# Patient Record
Sex: Female | Born: 1985 | Race: White | Hispanic: No | Marital: Married | Smoking: Never smoker
Health system: Southern US, Community
[De-identification: ages and names within clinical notes are randomized; demographics above are authoritative.]

## PROBLEM LIST (undated history)

## (undated) ENCOUNTER — Inpatient Hospital Stay: Payer: Self-pay

## (undated) DIAGNOSIS — F419 Anxiety disorder, unspecified: Secondary | ICD-10-CM

## (undated) DIAGNOSIS — E8801 Alpha-1-antitrypsin deficiency: Secondary | ICD-10-CM

## (undated) DIAGNOSIS — K219 Gastro-esophageal reflux disease without esophagitis: Secondary | ICD-10-CM

## (undated) DIAGNOSIS — I341 Nonrheumatic mitral (valve) prolapse: Secondary | ICD-10-CM

## (undated) DIAGNOSIS — K649 Unspecified hemorrhoids: Secondary | ICD-10-CM

## (undated) HISTORY — PX: ESOPHAGOGASTRODUODENOSCOPY ENDOSCOPY: SHX5814

## (undated) HISTORY — DX: Nonrheumatic mitral (valve) prolapse: I34.1

## (undated) HISTORY — DX: Anxiety disorder, unspecified: F41.9

## (undated) HISTORY — DX: Gastro-esophageal reflux disease without esophagitis: K21.9

## (undated) HISTORY — DX: Unspecified hemorrhoids: K64.9

## (undated) HISTORY — DX: Alpha-1-antitrypsin deficiency: E88.01

---
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
## 2006-07-28 ENCOUNTER — Emergency Department: Payer: Self-pay | Admitting: Internal Medicine

## 2006-07-28 ENCOUNTER — Other Ambulatory Visit: Payer: Self-pay

## 2008-06-06 ENCOUNTER — Ambulatory Visit: Payer: Self-pay | Admitting: Obstetrics & Gynecology

## 2009-07-01 ENCOUNTER — Emergency Department: Payer: Self-pay | Admitting: Emergency Medicine

## 2010-04-23 ENCOUNTER — Ambulatory Visit: Payer: Self-pay | Admitting: Family Medicine

## 2011-11-24 ENCOUNTER — Emergency Department: Payer: Self-pay | Admitting: Internal Medicine

## 2011-11-24 LAB — URINALYSIS, COMPLETE
Bilirubin,UR: NEGATIVE
Blood: NEGATIVE
Glucose,UR: NEGATIVE mg/dL (ref 0–75)
Leukocyte Esterase: NEGATIVE
Nitrite: NEGATIVE
Ph: 9 (ref 4.5–8.0)
Protein: NEGATIVE
WBC UR: <1 /HPF (ref 0–5)

## 2011-11-24 LAB — CK TOTAL AND CKMB (NOT AT ARMC): CK-MB: 0.5 ng/mL (ref 0.5–3.6)

## 2011-11-24 LAB — CBC
HCT: 37.3 % (ref 35.0–47.0)
MCH: 31.1 pg (ref 26.0–34.0)
MCV: 87 fL (ref 80–100)
RBC: 4.27 10*6/uL (ref 3.80–5.20)
RDW: 12.9 % (ref 11.5–14.5)
WBC: 5.7 10*3/uL (ref 3.6–11.0)

## 2011-11-24 LAB — BASIC METABOLIC PANEL
Anion Gap: 10 (ref 7–16)
BUN: 9 mg/dL (ref 7–18)
Calcium, Total: 9.4 mg/dL (ref 8.5–10.1)
Chloride: 108 mmol/L — ABNORMAL HIGH (ref 98–107)
Creatinine: 0.63 mg/dL (ref 0.60–1.30)
EGFR (Non-African Amer.): >60
Glucose: 92 mg/dL (ref 65–99)
Osmolality: 276 (ref 275–301)
Potassium: 3.2 mmol/L — ABNORMAL LOW (ref 3.5–5.1)
Sodium: 139 mmol/L (ref 136–145)

## 2011-11-24 LAB — TROPONIN I: Troponin-I: <0.02 ng/mL

## 2011-11-24 LAB — DRUG SCREEN, URINE
Barbiturates, Ur Screen: NEGATIVE (ref ?–200)
Cannabinoid 50 Ng, Ur ~~LOC~~: NEGATIVE (ref ?–50)
Cocaine Metabolite,Ur ~~LOC~~: NEGATIVE (ref ?–300)
Opiate, Ur Screen: NEGATIVE (ref ?–300)

## 2014-02-07 ENCOUNTER — Observation Stay: Payer: Self-pay

## 2014-02-07 LAB — PIH PROFILE
Anion Gap: 9 (ref 7–16)
BUN: 7 mg/dL (ref 7–18)
CALCIUM: 9.3 mg/dL (ref 8.5–10.1)
Chloride: 105 mmol/L (ref 98–107)
Co2: 24 mmol/L (ref 21–32)
Creatinine: 0.58 mg/dL — ABNORMAL LOW (ref 0.60–1.30)
EGFR (African American): >60
EGFR (Non-African Amer.): >60
Glucose: 69 mg/dL (ref 65–99)
HCT: 36.5 % (ref 35.0–47.0)
HGB: 12.3 g/dL (ref 12.0–16.0)
MCH: 29.1 pg (ref 26.0–34.0)
MCHC: 33.6 g/dL (ref 32.0–36.0)
MCV: 87 fL (ref 80–100)
Osmolality: 272 (ref 275–301)
POTASSIUM: 4.1 mmol/L (ref 3.5–5.1)
Platelet: 167 10*3/uL (ref 150–440)
RBC: 4.22 10*6/uL (ref 3.80–5.20)
RDW: 17.7 % — AB (ref 11.5–14.5)
SGOT(AST): 31 U/L (ref 15–37)
SODIUM: 138 mmol/L (ref 136–145)
Uric Acid: 5.6 mg/dL (ref 2.6–6.0)
WBC: 9.3 10*3/uL (ref 3.6–11.0)

## 2014-02-07 LAB — PROTEIN / CREATININE RATIO, URINE
Creatinine, Urine: 32 mg/dL (ref 30.0–125.0)
Protein, Random Urine: 8 mg/dL (ref 0–12)
Protein/Creat. Ratio: 250 mg/gCREAT — ABNORMAL HIGH (ref 0–200)

## 2014-02-08 ENCOUNTER — Observation Stay: Payer: Self-pay | Admitting: Obstetrics and Gynecology

## 2014-02-09 ENCOUNTER — Inpatient Hospital Stay: Payer: Self-pay | Admitting: Obstetrics and Gynecology

## 2014-02-09 LAB — CBC WITH DIFFERENTIAL/PLATELET
BASOS ABS: 0.1 10*3/uL (ref 0.0–0.1)
BASOS PCT: 0.3 %
EOS PCT: 0.4 %
Eosinophil #: 0.1 10*3/uL (ref 0.0–0.7)
HCT: 38.5 % (ref 35.0–47.0)
HGB: 12.8 g/dL (ref 12.0–16.0)
LYMPHS ABS: 1.7 10*3/uL (ref 1.0–3.6)
LYMPHS PCT: 10.7 %
MCH: 28.6 pg (ref 26.0–34.0)
MCHC: 33.2 g/dL (ref 32.0–36.0)
MCV: 86 fL (ref 80–100)
MONOS PCT: 5.8 %
Monocyte #: 0.9 x10 3/mm (ref 0.2–0.9)
NEUTROS ABS: 13.4 10*3/uL — AB (ref 1.4–6.5)
Neutrophil %: 82.8 %
Platelet: 212 10*3/uL (ref 150–440)
RBC: 4.47 10*6/uL (ref 3.80–5.20)
RDW: 17.8 % — ABNORMAL HIGH (ref 11.5–14.5)
WBC: 16.2 10*3/uL — AB (ref 3.6–11.0)

## 2014-02-11 LAB — HEMATOCRIT: HCT: 27.5 % — AB (ref 35.0–47.0)

## 2014-05-24 NOTE — H&P (Signed)
L&D Evaluation:  History Expanded:  HPI 29 yo G1 with EDD of 02/07/14 presents at 40 wks for evaluation after elevated BP in office. Denies ha, visual changes. No LOF or VB. +FM. Pt was seen at office for c/o irregular contractions throughout the day (Q 4-15 min)[facility], anxiety, isolation EIF not seen on f/u scan and anemia. A+/RNI/VI/ GBS negative. TDAP declined   Presents with contractions, BP evaluation   Patient's Medical History MVP, anxiety   Patient's Surgical History endoscopy   Medications Pre Natal Vitamins   Allergies Biaxin   Social History none   Exam:  Vital Signs stable  initial BP 140/91, otherwise completely normotensive   General no apparent distress   Mental Status clear   Chest clear   Abdomen gravid, non-tender   Pelvic no external lesions, 1/90/-2   Mebranes Intact   FHT normal rate with no decels, baseline 135, mod variability, + accels, no decels   Ucx irregular   Other PC Ratio: 215, H&H: 12.3 & 36.5, Plt: 167, Uric Acid: 5.6, SGOT 31   Impression:  Impression early labor, evaluation for PIH   Plan:  Comments Pre-eclampsia and labor precautions F/u as scheduled at office Pt declined scheduling IOL at 41 wks at this time   Electronic Signatures: Vella KohlerBrothers, Michail Boyte K (CNM)  (Signed 25-Jan-16 18:26)  Authored: L&D Evaluation   Last Updated: 25-Jan-16 18:26 by Vella KohlerBrothers, Davaris Youtsey K (CNM)

## 2014-05-24 NOTE — H&P (Signed)
L&D Evaluation:  History:  HPI 29 yo G1 with EDD of 02/07/14 presents at 40.2 wks presents to L&D with reports of contractions. She reports that she has been having contractions for the last 2 days. She was seen on L&D last night but did not change so she was sent home. She reports +FM, No LOF or VB. [facility], anxiety, isolation EIF not seen on f/u scan and anemia. A+/RNI/VI/ GBS negative. TDAP declined   Presents with contractions   Patient's Medical History other  MVP, anxiety   Patient's Surgical History other  endoscopy   Medications Pre Natal Vitamins   Allergies other, Biaxin   Social History none   Exam:  Vital Signs stable   General grimacing with contractions   Mental Status clear   Chest clear   Abdomen gravid, tender with contractions   Pelvic no external lesions, 5/90/-1 at 1350   Mebranes Intact   FHT normal rate with no decels, baseline 130, mod variability, + accels, no decels   Ucx regular, every 6   Skin dry, no lesions, no rashes   Impression:  Impression active labor, reactive NST, IUP at 40.1   Plan:  Plan expectant management, anticipate svd   Follow Up Appointment need to schedule. in 6 weeks   Electronic Signatures: Jannet MantisSubudhi, Ethon Wymer (CNM)  (Signed 27-Jan-16 14:55)  Authored: L&D Evaluation   Last Updated: 27-Jan-16 14:55 by Jannet MantisSubudhi, Chandlor Noecker (CNM)

## 2016-03-29 ENCOUNTER — Ambulatory Visit (INDEPENDENT_AMBULATORY_CARE_PROVIDER_SITE_OTHER): Payer: 59 | Admitting: Obstetrics and Gynecology

## 2016-03-29 ENCOUNTER — Encounter: Payer: Self-pay | Admitting: Obstetrics and Gynecology

## 2016-03-29 VITALS — BP 110/70 | HR 87 | Ht 66.0 in | Wt 143.0 lb

## 2016-03-29 DIAGNOSIS — Z01419 Encounter for gynecological examination (general) (routine) without abnormal findings: Secondary | ICD-10-CM

## 2016-03-29 DIAGNOSIS — Z124 Encounter for screening for malignant neoplasm of cervix: Secondary | ICD-10-CM

## 2016-03-29 DIAGNOSIS — Z1331 Encounter for screening for depression: Secondary | ICD-10-CM

## 2016-03-29 DIAGNOSIS — Z1389 Encounter for screening for other disorder: Secondary | ICD-10-CM

## 2016-03-29 DIAGNOSIS — Z1339 Encounter for screening examination for other mental health and behavioral disorders: Secondary | ICD-10-CM

## 2016-03-29 DIAGNOSIS — Z113 Encounter for screening for infections with a predominantly sexual mode of transmission: Secondary | ICD-10-CM | POA: Diagnosis not present

## 2016-03-29 LAB — POCT URINALYSIS DIPSTICK
Bilirubin, UA: NEGATIVE
Blood, UA: NEGATIVE
Glucose, UA: NEGATIVE
Ketones, UA: NEGATIVE
Nitrite, UA: NEGATIVE
PROTEIN UA: NEGATIVE
Spec Grav, UA: 1.025 (ref 1.030–1.035)
UROBILINOGEN UA: 1 (ref ?–2.0)
pH, UA: 6.5 (ref 5.0–8.0)

## 2016-03-29 NOTE — Progress Notes (Signed)
Gynecology Annual Exam  PCP: No PCP Per Patient  Chief Complaint  Patient presents with  . Gynecologic Exam    History of Present Illness:  [name] is a 31 y.o. G1P1001 who LMP was Patient's last menstrual period was 03/13/2016., presents today for her annual examination.  Her menses are regular every 28-30 days, lasting 5 day(s).  Dysmenorrhea none. She does not have intermenstrual bleeding.  She is single partner, contraception - coitus interruptus.  Last Pap: 4 year  Results were: no abnormalities /neg HPV DNA not done given age Hx of STDs: none  Last mammogram: n/a  There is a FH of breast cancer in her maternal grandmother who was in her 42s. There is no FH of ovarian cancer. The patient does do self-breast exams.  Tobacco use: The patient denies current or previous tobacco use. Alcohol use: none Exercise: moderately active  The patient is a vegan.  She is planned to conceive this year.   She has a few other questions.  She is concerned about a bump on her lower abdomen that has been called a probable cyst by her dermatologist.  It has been present for a while and has not changed. Sometimes tender to touch. She requests STD testing and a urine culture.  The patient wears seatbelts: yes.   The patient reports that domestic violence in her life is absent.    Patient wants STD screen for HIV, Hepatitis due to exposure 9 months ago on skin from a stranger. No symptoms. Just wants to be sure.   Review of Systems: Review of Systems  Constitutional: Negative.   HENT: Negative.   Eyes: Negative.   Respiratory: Negative.   Cardiovascular: Negative.   Gastrointestinal: Negative.   Genitourinary: Negative.   Musculoskeletal: Negative.   Skin: Negative.   Neurological: Negative.   Psychiatric/Behavioral: Negative.     Past Medical History:  Diagnosis Date  . Anxiety   . Mitral valve prolapse     Past Surgical History:  Procedure Laterality Date  .  ESOPHAGOGASTRODUODENOSCOPY ENDOSCOPY      Medications:   Medication Sig Start Date End Date Taking? Authorizing Provider  omega-3 acid ethyl esters (LOVAZA) 1 g capsule Take by mouth.    Historical Provider, MD  Prenatal Ca Carb-B6-B12-FA (BP FOLINATAL PLUS B) 1 MG TABS Take by mouth.    Historical Provider, MD   Allergies  Allergen Reactions  . Clarithromycin Hives  . Sulfa Antibiotics Hives    Gynecologic History: Patient's last menstrual period was 03/13/2016.  Obstetric History: G1P1001  Social History   Social History  . Marital status: Married    Spouse name: N/A  . Number of children: N/A  . Years of education: N/A   Occupational History  . Not on file.   Social History Main Topics  . Smoking status: Never Smoker  . Smokeless tobacco: Never Used  . Alcohol use No  . Drug use: No  . Sexual activity: Yes    Birth control/ protection: None   Other Topics Concern  . Not on file   Social History Narrative  . No narrative on file   Family History  Problem Relation Age of Onset  . Hypertension Father   . Prostate cancer Paternal Uncle   . Breast cancer Maternal Grandmother      Physical Exam BP 110/70   Pulse 87   Ht  (1.676 m)   Wt 143 lb (64.9 kg)   LMP 03/13/2016   BMI  23.08 kg/m   Physical Exam  Constitutional: She is oriented to person, place, and time and well-developed, well-nourished, and in no distress. No distress.  HENT:  Head: Normocephalic and atraumatic.  Eyes: Conjunctivae are normal. Left eye exhibits no discharge. No scleral icterus.  Neck: Normal range of motion. Neck supple.  Cardiovascular: Normal rate and regular rhythm.  Exam reveals no gallop and no friction rub.   No murmur heard. Pulmonary/Chest: Effort normal and breath sounds normal. No respiratory distress. She has no wheezes. She has no rales.  Abdominal: Soft. Bowel sounds are normal. She exhibits no distension and no mass. There is no tenderness. There is no  rebound and no guarding.  On patients left lower abdomen along the lower rectus muscle there is a 2mm x 2mm mobile, mildly ttp nodule.  There is no irregularity to its shape. It is located immediately above the rectus muscle. No overlying skin changes.  Genitourinary: Vagina normal, uterus normal, cervix normal, right adnexa normal, left adnexa normal and vulva normal.  Musculoskeletal: Normal range of motion. She exhibits no edema.  Lymphadenopathy:    She has no cervical adenopathy.  Neurological: She is alert and oriented to person, place, and time. No cranial nerve deficit.  Skin: Skin is warm and dry. No rash noted.  Psychiatric: Judgment normal.   Female chaperone present for pelvic and breast  portions of the physical exam  Results: AUDIT Questionnaire (screen for alcoholism): 0 PHQ-9: 1   Assessment: 31 y.o. G1P1001 here for routine annual gynecologic exam.  No concerning findings today. Plan: 1. Women's annual routine gynecological examination - POCT urinalysis dipstick - Urine culture - IGP, Aptima HPV, rfx 16/18,45  (pap smear)  2. Pap smear for cervical cancer screening - IGP, Aptima HPV, rfx 16/18,45  3. Screening for depression PHQ-9 negative  4. Screening for alcohol problem AUDIT Questionnaire negative  5. Screen for STD (sexually transmitted disease) Per patient request.  Also, requested gonorrhea and chlamydia testing with pap smear. - HIV antibody - Hepatitis B surface antibody - Hepatitis C antibody   6. Preconception counseling. Patient plans pregnancy later this year. She is taking a prenatal vitamin and has no other risk factors coming into the pregnancy.   7.  Routine healthcare maintenance including cholesterol, diabetes screening not indicated at this time.  8. Follow up 1 year for routine annual exam or as needed  Thomasene Mohair, MD 03/29/2016 3:31 PM

## 2016-03-31 LAB — URINE CULTURE: Organism ID, Bacteria: NO GROWTH

## 2016-04-02 ENCOUNTER — Encounter: Payer: Self-pay | Admitting: Obstetrics and Gynecology

## 2016-04-02 LAB — IGP, APTIMA HPV, RFX 16/18,45
HPV APTIMA: NEGATIVE
PAP SMEAR COMMENT: 0

## 2016-04-05 ENCOUNTER — Telehealth: Payer: Self-pay | Admitting: Obstetrics and Gynecology

## 2016-04-05 ENCOUNTER — Telehealth: Payer: Self-pay

## 2016-04-05 NOTE — Telephone Encounter (Signed)
Pt is would like a call back of Labs results CB# [phone]

## 2016-04-05 NOTE — Telephone Encounter (Signed)
Pt called.  She was seen last week.  She has the My Chart app.  She recv'd an msg about her urine results but has yet to hear about her pap smear. [phone]

## 2016-04-05 NOTE — Telephone Encounter (Signed)
Pt aware via vm neg results

## 2016-04-10 NOTE — Telephone Encounter (Signed)
Letter has been sent out for pt for results, however if she returns my call, please let me know

## 2016-05-14 ENCOUNTER — Other Ambulatory Visit: Payer: Self-pay | Admitting: Family Medicine

## 2016-05-14 ENCOUNTER — Encounter: Payer: Self-pay | Admitting: Obstetrics and Gynecology

## 2016-05-14 DIAGNOSIS — R1909 Other intra-abdominal and pelvic swelling, mass and lump: Secondary | ICD-10-CM

## 2016-05-15 ENCOUNTER — Ambulatory Visit
Admission: RE | Admit: 2016-05-15 | Discharge: 2016-05-15 | Disposition: A | Discharge status: Home / Self Care | Payer: 59 | Source: Ambulatory Visit | Attending: Family Medicine | Admitting: Family Medicine

## 2016-05-15 DIAGNOSIS — R1909 Other intra-abdominal and pelvic swelling, mass and lump: Secondary | ICD-10-CM | POA: Insufficient documentation

## 2016-05-15 DIAGNOSIS — R590 Localized enlarged lymph nodes: Secondary | ICD-10-CM | POA: Diagnosis not present

## 2016-05-16 ENCOUNTER — Telehealth: Payer: Self-pay | Admitting: *Deleted

## 2016-05-16 NOTE — Telephone Encounter (Signed)
Called patient and informed her that Dr. Merlene Pullingorcoran did not feel as if the appt was an emergency and it was ok to reschedule for her not to miss her family vacation, voiced understanding.

## 2016-05-28 ENCOUNTER — Ambulatory Visit: Payer: 59 | Admitting: Hematology and Oncology

## 2016-06-03 ENCOUNTER — Encounter: Payer: Self-pay | Admitting: Hematology and Oncology

## 2016-06-03 ENCOUNTER — Inpatient Hospital Stay: Payer: 59

## 2016-06-03 ENCOUNTER — Inpatient Hospital Stay: Payer: 59 | Attending: Hematology and Oncology | Admitting: Hematology and Oncology

## 2016-06-03 VITALS — BP 106/72 | HR 99 | Temp 98.2°F | Ht 66.0 in | Wt 144.1 lb

## 2016-06-03 DIAGNOSIS — I341 Nonrheumatic mitral (valve) prolapse: Secondary | ICD-10-CM | POA: Diagnosis not present

## 2016-06-03 DIAGNOSIS — R591 Generalized enlarged lymph nodes: Secondary | ICD-10-CM | POA: Diagnosis not present

## 2016-06-03 DIAGNOSIS — F419 Anxiety disorder, unspecified: Secondary | ICD-10-CM | POA: Insufficient documentation

## 2016-06-03 DIAGNOSIS — K219 Gastro-esophageal reflux disease without esophagitis: Secondary | ICD-10-CM | POA: Insufficient documentation

## 2016-06-03 DIAGNOSIS — Z79899 Other long term (current) drug therapy: Secondary | ICD-10-CM | POA: Insufficient documentation

## 2016-06-03 DIAGNOSIS — R599 Enlarged lymph nodes, unspecified: Secondary | ICD-10-CM

## 2016-06-03 LAB — COMPREHENSIVE METABOLIC PANEL
ALT: 14 U/L (ref 14–54)
AST: 22 U/L (ref 15–41)
Albumin: 5 g/dL (ref 3.5–5.0)
Alkaline Phosphatase: 47 U/L (ref 38–126)
Anion gap: 7 (ref 5–15)
BUN: 12 mg/dL (ref 6–20)
CO2: 24 mmol/L (ref 22–32)
Calcium: 10.3 mg/dL (ref 8.9–10.3)
Chloride: 107 mmol/L (ref 101–111)
Creatinine, Ser: 0.61 mg/dL (ref 0.44–1.00)
GFR calc Af Amer: >60 mL/min (ref >60)
GFR calc non Af Amer: >60 mL/min (ref >60)
Glucose, Bld: 104 mg/dL — ABNORMAL HIGH (ref 65–99)
Potassium: 3.9 mmol/L (ref 3.5–5.1)
Sodium: 138 mmol/L (ref 135–145)
Total Bilirubin: 0.6 mg/dL (ref 0.3–1.2)
Total Protein: 8.1 g/dL (ref 6.5–8.1)

## 2016-06-03 LAB — CBC WITH DIFFERENTIAL/PLATELET
Basophils Absolute: 0 10*3/uL (ref 0–0.1)
Basophils Relative: 1 %
Eosinophils Absolute: 0.1 10*3/uL (ref 0–0.7)
Eosinophils Relative: 2 %
HCT: 37.8 % (ref 35.0–47.0)
Hemoglobin: 13.3 g/dL (ref 12.0–16.0)
Lymphocytes Relative: 29 %
Lymphs Abs: 1.6 10*3/uL (ref 1.0–3.6)
MCH: 30.8 pg (ref 26.0–34.0)
MCHC: 35.3 g/dL (ref 32.0–36.0)
MCV: 87.1 fL (ref 80.0–100.0)
Monocytes Absolute: 0.4 10*3/uL (ref 0.2–0.9)
Monocytes Relative: 8 %
Neutro Abs: 3.3 10*3/uL (ref 1.4–6.5)
Neutrophils Relative %: 60 %
Platelets: 249 10*3/uL (ref 150–440)
RBC: 4.34 MIL/uL (ref 3.80–5.20)
RDW: 13.2 % (ref 11.5–14.5)
WBC: 5.4 10*3/uL (ref 3.6–11.0)

## 2016-06-03 LAB — URIC ACID: Uric Acid, Serum: 4.5 mg/dL (ref 2.3–6.6)

## 2016-06-03 LAB — LACTATE DEHYDROGENASE: LDH: 128 U/L (ref 98–192)

## 2016-06-03 NOTE — Progress Notes (Signed)
Patient here today as new evaluation regarding lymphadenopathy. Ref[facility] Wall[facility]w [facility].

## 2016-06-03 NOTE — Progress Notes (Signed)
[facility] day:  06/03/2016  Chief Complaint: Allyanna Appleman is a 31 y.o. female with adenopathy who is referred in consultation by Dr. Mariana Arn for assessment and management.  HPI: The patient felt a lump in her left groin when she was pregnant. After she lost her pregnancy weight, the lump was easier to feel.  Over the past 2 years, she has not noted much change. She states that her dermatologist felt the area and thought it was a "cyst".  Two weeks after the flu, she felt the area "divided".   She has worried about this area, but did not have a regular physician. She went to the acute care clinic.  She was told she had a swollen lymph node. She states that she feels like she has 2 small things under her skin. She also notes a tiny area under her jaw. She has some sinus drainage.  She denies any fevers, sweats or weight loss.  Left lower extremity soft tissue ultrasound on 05/15/2016 revealed multiple lymph nodes in the left inguinal region with the largest measuring 4 mm. Nodes were likely inflammatory or reactive.   She describes herself as a "germaphobe".  She asks about getting additional labs (hepatitis and HIV testing) drawn after someone threw up on her at a concert.  She had dry cracked skin.  She was scheduled to have these labs drawn today.   Past Medical History:  Diagnosis Date  . Alpha-1-antitrypsin deficiency ([place])   . Anxiety   . GERD (gastroesophageal reflux disease)   . Hemorrhoids   . Mitral valve prolapse     Past Surgical History:  Procedure Laterality Date  . ESOPHAGOGASTRODUODENOSCOPY ENDOSCOPY      Family History  Problem Relation Age of Onset  . Hypertension Father   . Prostate cancer Paternal Uncle   . Breast cancer Maternal Grandmother     Social History:  reports that she has never smoked. She has never used smokeless tobacco. She reports that she does not drink alcohol or use drugs.  She denies any  exposure to radiation or toxins.  She previously was a Pharmacist, hospital in school.  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  She has a daughter at home.  She describes herself as a vegan and a germaphobe.  The patient is alone today.  Allergies:  Allergies  Allergen Reactions  . Clarithromycin Hives  . Robitussin (Alcohol Free) [Guaifenesin] Hives  . Sulfa Antibiotics Hives    Current Medications: Current Outpatient Prescriptions  Medication Sig Dispense Refill  . Cholecalciferol (VITAMIN D3) 1000 units CAPS Take 1 capsule by mouth daily.    Marland Kitchen omega-3 acid ethyl esters (LOVAZA) 1 g capsule Take by mouth.    . Prenatal Ca Carb-B6-B12-FA (BP FOLINATAL PLUS B) 1 MG TABS Take by mouth.     No current facility-administered medications for this visit.     Review of Systems:  GENERAL:  Feels good. No fevers, sweats or weight loss. PERFORMANCE STATUS (ECOG):  0 HEENT:  "Weird taste in mouth" (? cavity).  Slight sore throat.  Sinus drainage.  No visual changes, runny nose, mouth sores or tenderness. Lungs: No shortness of breath or cough.  No hemoptysis. Cardiac:  No chest pain, palpitations, orthopnea, or PND. GI:  No nausea, vomiting, diarrhea, constipation, melena or hematochezia. GU:  No urgency, frequency, dysuria, or hematuria. Musculoskeletal:  No back pain.  No joint pain.  No muscle tenderness. Extremities:  No pain or swelling. Skin:  No rashes or skin changes. Neuro:  No headache, numbness or weakness, balance or coordination issues. Endocrine:  No diabetes, thyroid issues, hot flashes or night sweats. Psych:  No mood changes, depression or anxiety. Pain:  No focal pain. Review of systems:  All other systems reviewed and found to be negative.  Physical Exam: Blood pressure 106/72, pulse 99, temperature 98.2 F (36.8 C), temperature source Tympanic, height '5\' 6"'$  (1.676 m), weight 144 lb 2 oz (65.4 kg). GENERAL:  Well developed, well nourished, woman sitting comfortably in the  exam room in no acute distress. MENTAL STATUS:  Alert and oriented to person, place and time. HEAD:  Long brown hair.  Normocephalic, atraumatic, face symmetric, no Cushingoid features. EYES:  Blue eyes.  Pupils equal round and reactive to light and accomodation.  No conjunctivitis or scleral icterus. ENT:  Oropharynx clear without lesion.  Tongue normal. Mucous membranes moist.  RESPIRATORY:  Clear to auscultation without rales, wheezes or rhonchi. CARDIOVASCULAR:  Regular rate and rhythm without murmur, rub or gallop. ABDOMEN:  Soft, non-tender, with active bowel sounds, and no hepatosplenomegaly.  No masses. SKIN:  No rashes, ulcers or lesions.  No cystic or nodular areas in the suprapubic area (her area of concern). EXTREMITIES: No edema, no skin discoloration or tenderness.  No palpable cords. LYMPH NODES: No palpable cervical, supraclavicular, axillary or inguinal adenopathy. NEUROLOGICAL: Unremarkable. PSYCH:  Appropriate.   Appointment on 06/03/2016  Component Date Value Ref Range Status  . WBC 06/03/2016 5.4  3.6 - 11.0 K/uL Final  . RBC 06/03/2016 4.34  3.80 - 5.20 MIL/uL Final  . Hemoglobin 06/03/2016 13.3  12.0 - 16.0 g/dL Final  . HCT 06/03/2016 37.8  35.0 - 47.0 % Final  . MCV 06/03/2016 87.1  80.0 - 100.0 fL Final  . MCH 06/03/2016 30.8  26.0 - 34.0 pg Final  . MCHC 06/03/2016 35.3  32.0 - 36.0 g/dL Final  . RDW 06/03/2016 13.2  11.5 - 14.5 % Final  . Platelets 06/03/2016 249  150 - 440 K/uL Final  . Neutrophils Relative % 06/03/2016 60  % Final  . Neutro Abs 06/03/2016 3.3  1.4 - 6.5 K/uL Final  . Lymphocytes Relative 06/03/2016 29  % Final  . Lymphs Abs 06/03/2016 1.6  1.0 - 3.6 K/uL Final  . Monocytes Relative 06/03/2016 8  % Final  . Monocytes Absolute 06/03/2016 0.4  0.2 - 0.9 K/uL Final  . Eosinophils Relative 06/03/2016 2  % Final  . Eosinophils Absolute 06/03/2016 0.1  0 - 0.7 K/uL Final  . Basophils Relative 06/03/2016 1  % Final  . Basophils Absolute  06/03/2016 0.0  0 - 0.1 K/uL Final  . Sodium 06/03/2016 138  135 - 145 mmol/L Final  . Potassium 06/03/2016 3.9  3.5 - 5.1 mmol/L Final  . Chloride 06/03/2016 107  101 - 111 mmol/L Final  . CO2 06/03/2016 24  22 - 32 mmol/L Final  . Glucose, Bld 06/03/2016 104* 65 - 99 mg/dL Final  . BUN 06/03/2016 12  6 - 20 mg/dL Final  . Creatinine, Ser 06/03/2016 0.61  0.44 - 1.00 mg/dL Final  . Calcium 06/03/2016 10.3  8.9 - 10.3 mg/dL Final  . Total Protein 06/03/2016 8.1  6.5 - 8.1 g/dL Final  . Albumin 06/03/2016 5.0  3.5 - 5.0 g/dL Final  . AST 06/03/2016 22  15 - 41 U/L Final  . ALT 06/03/2016 14  14 - 54 U/L Final  . Alkaline  Phosphatase 06/03/2016 47  38 - 126 U/L Final  . Total Bilirubin 06/03/2016 0.6  0.3 - 1.2 mg/dL Final  . GFR calc non Af Amer 06/03/2016 >60  >60 mL/min Final  . GFR calc Af Amer 06/03/2016 >60  >60 mL/min Final   Comment: (NOTE) The eGFR has been calculated using the CKD EPI equation. This calculation has not been validated in all clinical situations. eGFR's persistently <60 mL/min signify possible Chronic Kidney Disease.   . Anion gap 06/03/2016 7  5 - 15 Final  . LDH 06/03/2016 128  98 - 192 U/L Final  . Uric Acid, Serum 06/03/2016 4.5  2.3 - 6.6 mg/dL Final    Assessment:  Noelie Avya Flavell is a 31 y.o. female with reactive lymphadenopathy.  She denies any fevers, sweats, or weight loss.  Left lower extremity soft tissue ultrasound on 05/15/2016 revealed multiple lymph nodes in the left inguinal region with the largest measuring 4 mm.  Nodes were likely inflammatory or reactive.  She denies any B symptoms.  Exam reveals no adenopathy or cystic areas.  Plan: 1.  Discuss patient's concerns and recent ultrasound.  Exam reveals no findings concerning for lymphoma or malignancy.  Discuss screening labs.  Reassurance provided. 2.  Labs today:  CBC with diff, CMP,  LDH, uric acid. 3.  RN to call patient with results. 4.  RTC prn.   Lequita Asal, MD   06/03/2016

## 2016-06-04 ENCOUNTER — Telehealth: Payer: Self-pay | Admitting: *Deleted

## 2016-06-04 NOTE — Telephone Encounter (Signed)
Called patient and discussed that all labs are wnl, voiced understanding.

## 2016-06-04 NOTE — Telephone Encounter (Signed)
Called stating she was expecting a call by the end of the day yesterday with her lab results. Please call her. [phone]

## 2016-06-04 NOTE — Telephone Encounter (Signed)
Called patient

## 2016-06-10 ENCOUNTER — Encounter: Payer: Self-pay | Admitting: Hematology and Oncology

## 2016-08-29 ENCOUNTER — Telehealth: Payer: Self-pay

## 2016-08-29 NOTE — Telephone Encounter (Signed)
Pt states she is coming for lab work tomorrow and wanted to have an order added on for her to have her lead levels checked because she lives in an old house and plans to start trying to conceive soon.   Attempted to reach patient due to not on lab schedule and wanted to see what labs she is having done. Unable to leave msg, voicemail full.

## 2016-08-30 ENCOUNTER — Other Ambulatory Visit: Payer: 59

## 2016-08-31 LAB — HEPATITIS C ANTIBODY: Hep C Virus Ab: <0.1 s/co ratio (ref 0.0–0.9)

## 2016-08-31 LAB — HEPATITIS B SURFACE ANTIBODY,QUALITATIVE: HEP B SURFACE AB, QUAL: REACTIVE

## 2016-08-31 LAB — HIV ANTIBODY (ROUTINE TESTING W REFLEX): HIV SCREEN 4TH GENERATION: NONREACTIVE

## 2016-09-04 NOTE — Telephone Encounter (Signed)
Pt did not have lead levels drawn.

## 2016-09-19 ENCOUNTER — Other Ambulatory Visit (INDEPENDENT_AMBULATORY_CARE_PROVIDER_SITE_OTHER): Payer: 59

## 2016-09-19 ENCOUNTER — Encounter: Payer: Self-pay | Admitting: Obstetrics and Gynecology

## 2016-09-19 ENCOUNTER — Ambulatory Visit (INDEPENDENT_AMBULATORY_CARE_PROVIDER_SITE_OTHER): Payer: 59 | Admitting: Obstetrics and Gynecology

## 2016-09-19 VITALS — BP 100/60 | HR 89 | Ht 66.0 in | Wt 148.0 lb

## 2016-09-19 DIAGNOSIS — R1031 Right lower quadrant pain: Secondary | ICD-10-CM | POA: Diagnosis not present

## 2016-09-19 DIAGNOSIS — R140 Abdominal distension (gaseous): Secondary | ICD-10-CM

## 2016-09-19 DIAGNOSIS — R102 Pelvic and perineal pain: Secondary | ICD-10-CM

## 2016-09-19 DIAGNOSIS — I341 Nonrheumatic mitral (valve) prolapse: Secondary | ICD-10-CM | POA: Insufficient documentation

## 2016-09-19 NOTE — Progress Notes (Signed)
Chief Complaint  Patient presents with  . Abdominal Pain    HPI:      [name] is a 31 y.o. G1P1001 who LMP was Patient's last menstrual period was 08/25/2016., presents today for pelvic and RLQ pain for the past 4 days. Sx felt like a tight rope being pulled from pubic bone to belly button. Sx improved if pt curled up in fetal position. Pt has had sx before that only lasted a couple minutes, but sx persisted this time. Pain then started radiating to RT low back/hip.  She saw Urgent care  Yesterday and was started on macrobid for UTI. Pt had neg UPT, neg CBC, no fevers. Pt states sx are improving since abx. She has not had any n/v/d/constipation but was having frequent formed BMs before sx started. She also went dancing the day before sx and wondered if she strained muscle, but she works out regularly anyway.    Pt's menses are monthly, lasting 6 days, no BTB, mild dysmen. She has noted bloating recently from ovulation to her menses. She has tried diet/exercise changes with some wt loss, but bloating happens monthly. She is concerned about ovar ca.   She is considering conception 1/19. She has a hx of MVP that hasn't been eval recently.    Past Medical History:  Diagnosis Date  . Alpha-1-antitrypsin deficiency ([facility])   . Anxiety   . GERD (gastroesophageal reflux disease)   . Hemorrhoids   . Mitral valve prolapse     Past Surgical History:  Procedure Laterality Date  . ESOPHAGOGASTRODUODENOSCOPY ENDOSCOPY      Family History  Problem Relation Age of Onset  . Hypertension Father   . Prostate cancer Paternal Uncle   . Breast cancer Maternal Grandmother     Social History   Social History  . Marital status: Married    Spouse name: N/A  . Number of children: N/A  . Years of education: N/A   Occupational History  . Not on file.   Social History Main Topics  . Smoking status: Never Smoker  . Smokeless tobacco: Never Used  . Alcohol use No  . Drug use: No    . Sexual activity: Yes    Birth control/ protection: None   Other Topics Concern  . Not on file   Social History Narrative  . No narrative on file     Current Outpatient Prescriptions:  .  Cholecalciferol (VITAMIN D3) 1000 units CAPS, Take 1 capsule by mouth daily., Disp: , Rfl:  .  omega-3 acid ethyl esters (LOVAZA) 1 g capsule, Take by mouth., Disp: , Rfl:  .  Prenatal Ca Carb-B6-B12-FA (BP FOLINATAL PLUS B) 1 MG TABS, Take by mouth., Disp: , Rfl:    ROS:  Review of Systems  Constitutional: Negative for fever.  Gastrointestinal: Negative for blood in stool, constipation, diarrhea, nausea and vomiting.  Genitourinary: Positive for pelvic pain. Negative for dyspareunia, dysuria, flank pain, frequency, hematuria, urgency, vaginal bleeding, vaginal discharge and vaginal pain.  Musculoskeletal: Positive for back pain.  Skin: Negative for rash.     OBJECTIVE:   Vitals:  BP 100/60   Pulse 89   Ht  (1.676 m)   Wt 148 lb (67.1 kg)   LMP 08/25/2016   BMI 23.89 kg/m   Physical Exam  Constitutional: She is oriented to person, place, and time and well-developed, well-nourished, and in no distress. Vital signs are normal.  Abdominal: Soft. Normal appearance. There is no tenderness. There  is no rigidity, no guarding and no CVA tenderness.  Genitourinary: Vagina normal, uterus normal, cervix normal, right adnexa normal, left adnexa normal and vulva normal. Uterus is not enlarged. Cervix exhibits no motion tenderness and no tenderness. Right adnexum displays no mass and no tenderness. Left adnexum displays no mass and no tenderness. Vulva exhibits no erythema, no exudate, no lesion, no rash and no tenderness. Vagina exhibits no lesion.  Neurological: She is oriented to person, place, and time.  Vitals reviewed.   Results: GYN U/S-->EM=9.12 MM; RTO WITH 2.2 X 2.5 CM FOLLICLE; LTO WITH FOLLICLES; NO FF IN CDS  Assessment/Plan: RLQ abdominal pain - Sx improving with abx for  UTI. Cont abx. U/S essentially neg except RTO follicle. Most likely not cause of sx. REchk u/s in 8 wks. F/u if sx persist. - Plan: US PELVIS TRANSVANGINAL NON-OB (TV ONLY)  Suprapubic pain  Bloating symptom - Cyclical with menses. Most likely hormonal. Neg GYN u/s.     Return if symptoms worsen or fail to improve.  Gabrielle Mcdowell B. Arieona Swaggerty, PA-C 09/19/2016 8:00 PM

## 2016-12-19 ENCOUNTER — Telehealth: Payer: Self-pay | Admitting: Obstetrics and Gynecology

## 2016-12-19 NOTE — Telephone Encounter (Signed)
GYN u/s for RTO cyst. ABC to call pt with results. Pls sched. Thx

## 2016-12-19 NOTE — Telephone Encounter (Signed)
PT is calling about Follow up on Gyn scan. Pt is wishing to schedule . Please advise

## 2016-12-19 NOTE — Telephone Encounter (Signed)
Called and left voicemail for patient to call back to be schedule for u/s and follow up for left breast exam

## 2016-12-24 ENCOUNTER — Other Ambulatory Visit: Payer: 59

## 2016-12-24 ENCOUNTER — Ambulatory Visit: Payer: 59 | Admitting: Obstetrics and Gynecology

## 2016-12-24 ENCOUNTER — Other Ambulatory Visit: Payer: Self-pay | Admitting: Obstetrics and Gynecology

## 2016-12-24 DIAGNOSIS — N83201 Unspecified ovarian cyst, right side: Secondary | ICD-10-CM

## 2016-12-30 ENCOUNTER — Encounter: Payer: Self-pay | Admitting: Obstetrics and Gynecology

## 2016-12-30 ENCOUNTER — Ambulatory Visit (INDEPENDENT_AMBULATORY_CARE_PROVIDER_SITE_OTHER): Payer: 59 | Admitting: Obstetrics and Gynecology

## 2016-12-30 ENCOUNTER — Ambulatory Visit (INDEPENDENT_AMBULATORY_CARE_PROVIDER_SITE_OTHER): Payer: 59

## 2016-12-30 VITALS — BP 102/66 | HR 84 | Ht 66.0 in | Wt 150.0 lb

## 2016-12-30 DIAGNOSIS — N643 Galactorrhea not associated with childbirth: Secondary | ICD-10-CM

## 2016-12-30 DIAGNOSIS — N83201 Unspecified ovarian cyst, right side: Secondary | ICD-10-CM

## 2016-12-30 DIAGNOSIS — Z3169 Encounter for other general counseling and advice on procreation: Secondary | ICD-10-CM | POA: Diagnosis not present

## 2016-12-30 DIAGNOSIS — N6322 Unspecified lump in the left breast, upper inner quadrant: Secondary | ICD-10-CM | POA: Diagnosis not present

## 2016-12-30 NOTE — Progress Notes (Signed)
Chief Complaint  Patient presents with  . Follow up U/S  . Breast knot    Left breast knot tender    HPI:      [name] is a 31 y.o. G1P1001 who LMP was Patient's last menstrual period was 12/11/2016 (approximate)., presents today for u/s f/u for RTO cyst 9/18. Cyst was small at about 2 cm, but pt was having RLQ pain and bloating, so u/s done. Pt also diagnosed with UTI at that time.  Pt states RLQ pain resolved after UTI tx and is doing fine.   Pt also notes pain in LT breast for the past 2-3 wks with breast mass, feels like 2 knots next to each other. Initially had LT axillary pain, too but that has resolved. Mass Size has decreased per pt report since she first noticed it. Pt with hx of fibrocystic breasts prior to pregnancy, but breast tissue has been much better since pregnancy and breastfeeding, until now. Pt also still has galactorrhea with nipple manipulation, even though she stopped breastfeeding about 18 months ago. No recent labs done. Hx of breast ca in GGM.  Pt would like to conceive this month. Taking PNVs. Is vegan and would like B12 anemia labs done. Had "special lab" with Dr. Vergie LivingPickens 2016 since vegan.    Past Medical History:  Diagnosis Date  . Alpha-1-antitrypsin deficiency (HCC)   . Anxiety   . GERD (gastroesophageal reflux disease)   . Hemorrhoids   . Mitral valve prolapse     Past Surgical History:  Procedure Laterality Date  . ESOPHAGOGASTRODUODENOSCOPY ENDOSCOPY      Family History  Problem Relation Age of Onset  . Hypertension Father   . Prostate cancer Paternal Uncle   . Breast cancer Other     Social History   Socioeconomic History  . Marital status: Married    Spouse name: Not on file  . Number of children: Not on file  . Years of education: Not on file  . Highest education level: Not on file  Social Needs  . Financial resource strain: Not on file  . Food insecurity - worry: Not on file  . Food insecurity - inability: Not on  file  . Transportation needs - medical: Not on file  . Transportation needs - non-medical: Not on file  Occupational History  . Not on file  Tobacco Use  . Smoking status: Never Smoker  . Smokeless tobacco: Never Used  Substance and Sexual Activity  . Alcohol use: No  . Drug use: No  . Sexual activity: Yes    Birth control/protection: None  Other Topics Concern  . Not on file  Social History Narrative  . Not on file     Current Outpatient Medications:  .  B Complex Vitamins (VITAMIN B COMPLEX PO), Take by mouth., Disp: , Rfl:  .  Cholecalciferol (VITAMIN D3) 1000 units CAPS, Take 1 capsule by mouth daily., Disp: , Rfl:  .  omega-3 acid ethyl esters (LOVAZA) 1 g capsule, Take by mouth., Disp: , Rfl:  .  Prenatal Ca Carb-B6-B12-FA (BP FOLINATAL PLUS B) 1 MG TABS, Take by mouth., Disp: , Rfl:    ROS:  Review of Systems  Constitutional: Negative for fever.  Gastrointestinal: Negative for blood in stool, constipation, diarrhea, nausea and vomiting.  Genitourinary: Negative for dyspareunia, dysuria, flank pain, frequency, hematuria, urgency, vaginal bleeding, vaginal discharge and vaginal pain.  Musculoskeletal: Negative for back pain.  Skin: Negative for rash.  Breast ROS: positive  for - galactorrhea and new or changing breast lumps   OBJECTIVE:   Vitals:  BP 102/66 (BP Location: Left Arm, Patient Position: Sitting, Cuff Size: Normal)   Pulse 84   Ht 5\' 6"  (1.676 m)   Wt 150 lb (68 kg)   LMP 12/11/2016 (Approximate)   BMI 24.21 kg/m   Physical Exam  Constitutional: She is oriented to person, place, and time and well-developed, well-nourished, and in no distress.  Pulmonary/Chest: Right breast exhibits no inverted nipple, no mass, no nipple discharge, no skin change and no tenderness. Left breast exhibits mass. Left breast exhibits no inverted nipple, no nipple discharge, no skin change and no tenderness. Breasts are symmetrical.    ~2 CM FIRM, PROMINENT, KNOTTY AREA  2:00 POS LT BREAST; NO GALACTORRHEA TODAY BILAT  Lymphadenopathy:    She has no axillary adenopathy.  Neurological: She is alert and oriented to person, place, and time.  Psychiatric: Affect and judgment normal.  Vitals reviewed.   Results:  GYN U/S-->    Assessment/Plan: Cyst of right ovary - Slightly larger on u/s. Same cyst per u/s. No sx. F/u prn.   Mass of upper inner quadrant of left breast - Check dx mammo/u/s. Will f/u with results. If neg, question prominent tissue. - Plan: MM DIAG BREAST TOMO BILATERAL, US BREAST LTD UNI LEFT INC AXILLA, US BREAST LTD UNI RIGHT INC AXILLA  Galactorrhea - Check labs. If neg, reassurance. - Plan: TSH + free T4, Prolactin  Pre-conception counseling - Anemia labs. Cont PNVs. Will f/u with results. - Plan: Methylmalonic acid, serum, Folate    Return if symptoms worsen or fail to improve.  Reed Dady B. Freda Jaquith, PA-C 12/31/2016 10:17 AM

## 2016-12-30 NOTE — Patient Instructions (Signed)
I value your feedback and entrusting us with your care. If you get a [place] patient survey, I would appreciate you taking the time to let us know about your experience today. Thank you! 

## 2016-12-31 ENCOUNTER — Other Ambulatory Visit: Payer: Self-pay | Admitting: Obstetrics & Gynecology

## 2016-12-31 ENCOUNTER — Encounter: Payer: Self-pay | Admitting: Obstetrics and Gynecology

## 2016-12-31 NOTE — Progress Notes (Signed)
Review of ULTRASOUND.    I have personally reviewed images and report of recent [facility].    Plan of management to be discussed with patient.    Discussed w PA Copland  Annamarie MajorPaul Isaid Salvia, MD, Merlinda FrederickFACOG [facility], [facility] 12/31/2016  8:10 AM

## 2016-12-31 NOTE — Progress Notes (Signed)
Review of ULTRASOUND.    I have personally reviewed images and report [facility].    Plan of management to be discussed with patient.    Management discussed with Althea GrimmerAlicia Copland, PA    Monitor for cyst pain or bloating.  As has increased in size since last exam, may want to follow with ultrasound in 2-3 mos.  Annamarie MajorPaul Gianlucas Evenson, MD, Merlinda FrederickFACOG [facility], [facility] 12/31/2016  8:02 AM

## 2017-01-01 ENCOUNTER — Ambulatory Visit
Admission: RE | Admit: 2017-01-01 | Discharge: 2017-01-01 | Disposition: A | Discharge status: Home / Self Care | Payer: 59 | Source: Ambulatory Visit | Attending: Obstetrics and Gynecology | Admitting: Obstetrics and Gynecology

## 2017-01-01 ENCOUNTER — Other Ambulatory Visit: Payer: Self-pay | Admitting: Obstetrics and Gynecology

## 2017-01-01 ENCOUNTER — Encounter: Payer: Self-pay | Admitting: Obstetrics and Gynecology

## 2017-01-01 DIAGNOSIS — N6322 Unspecified lump in the left breast, upper inner quadrant: Secondary | ICD-10-CM

## 2017-01-01 DIAGNOSIS — R921 Mammographic calcification found on diagnostic imaging of breast: Secondary | ICD-10-CM

## 2017-01-02 ENCOUNTER — Other Ambulatory Visit: Payer: 59

## 2017-01-02 DIAGNOSIS — Z3169 Encounter for other general counseling and advice on procreation: Secondary | ICD-10-CM

## 2017-01-02 DIAGNOSIS — N643 Galactorrhea not associated with childbirth: Secondary | ICD-10-CM

## 2017-01-02 DIAGNOSIS — R7989 Other specified abnormal findings of blood chemistry: Secondary | ICD-10-CM

## 2017-01-03 ENCOUNTER — Ambulatory Visit
Admission: RE | Admit: 2017-01-03 | Discharge: 2017-01-03 | Disposition: A | Discharge status: Home / Self Care | Payer: 59 | Source: Ambulatory Visit | Attending: Obstetrics and Gynecology | Admitting: Obstetrics and Gynecology

## 2017-01-03 ENCOUNTER — Other Ambulatory Visit: Payer: Self-pay | Admitting: Obstetrics and Gynecology

## 2017-01-03 DIAGNOSIS — R921 Mammographic calcification found on diagnostic imaging of breast: Secondary | ICD-10-CM

## 2017-01-06 LAB — METHYLMALONIC ACID, SERUM: Methylmalonic Acid: 315 nmol/L (ref 0–378)

## 2017-01-06 LAB — PROLACTIN: PROLACTIN: 13.8 ng/mL (ref 4.8–23.3)

## 2017-01-06 LAB — FOLATE: FOLATE: 17.9 ng/mL (ref >3.0)

## 2017-01-06 LAB — TSH+FREE T4
FREE T4: 1.26 ng/dL (ref 0.82–1.77)
TSH: 0.407 u[IU]/mL — ABNORMAL LOW (ref 0.450–4.500)

## 2017-01-06 NOTE — Progress Notes (Signed)
Repeat thyroid chck due to low TSH 12/18.

## 2017-01-14 NOTE — L&D Delivery Note (Signed)
Delivery Note At 10:14 AM a viable female was delivered via Vaginal, Spontaneous (Presentation: ROA, compound right hand).  APGAR: 8, 8; weight pending.   Placenta status: delivered spontaneously, intact.  Cord: 3VC with the following complications: None.  Cord pH: n/a  Anesthesia:  none Episiotomy: None Lacerations: 1st degree;Vaginal;Perineal Suture Repair: n/a - hemostatic without repair Est. Blood Loss (mL): 250  Mom to postpartum.  Baby to Couplet care / Skin to Skin.  Called to see patient.  Mom pushed to deliver a viable female infant.  The head followed by shoulders, which delivered without difficulty, and the rest of the body.  No nuchal cord noted.  Baby to mom's chest.  Cord clamped and cut after > 1 min delay.  No cord blood obtained.  Placenta delivered spontaneously, intact, with a 3-vessel cord.  A small, vaginal/right labium minus laceration noted and hemostatic. A 2-3 cm 1st degree perineal laceration noted and hemostatic with pressure only.  All counts correct.  Hemostasis obtained with IV pitocin and fundal massage. QBL 250 mL.     Thomasene Mohair, MD 10/19/2017, 10:45 AM

## 2017-01-26 ENCOUNTER — Encounter: Payer: Self-pay | Admitting: Obstetrics and Gynecology

## 2017-01-27 ENCOUNTER — Other Ambulatory Visit: Payer: Self-pay | Admitting: Obstetrics and Gynecology

## 2017-01-27 DIAGNOSIS — Z113 Encounter for screening for infections with a predominantly sexual mode of transmission: Secondary | ICD-10-CM

## 2017-01-27 NOTE — Progress Notes (Signed)
Pt wants HSV 1 testing for future pregnancy. Will do HSV 2 as well.

## 2017-02-03 ENCOUNTER — Other Ambulatory Visit: Payer: BLUE CROSS/BLUE SHIELD

## 2017-02-03 DIAGNOSIS — R7989 Other specified abnormal findings of blood chemistry: Secondary | ICD-10-CM

## 2017-02-03 DIAGNOSIS — Z113 Encounter for screening for infections with a predominantly sexual mode of transmission: Secondary | ICD-10-CM

## 2017-02-05 ENCOUNTER — Telehealth: Payer: Self-pay | Admitting: Obstetrics and Gynecology

## 2017-02-05 LAB — HSV(HERPES SMPLX)ABS-I+II(IGG+IGM)-BLD
HSV 1 Glycoprotein G Ab, IgG: <0.91 index (ref 0.00–0.90)
HSVI/II Comb IgM: <0.91 Ratio (ref 0.00–0.90)

## 2017-02-05 LAB — TSH+FREE T4
Free T4: 1.2 ng/dL (ref 0.82–1.77)
TSH: 0.646 u[IU]/mL (ref 0.450–4.500)

## 2017-02-05 NOTE — Telephone Encounter (Signed)
LM that labs were neg. Did she listen to Vm? RN to notify pt.

## 2017-02-05 NOTE — Telephone Encounter (Signed)
Pt is calling due to missed call from Dr. Patsy Lageropland please Gabrielle Mcdowell

## 2017-02-06 NOTE — Telephone Encounter (Signed)
Unable to get in touch with patient. Mailbox is full

## 2017-02-10 ENCOUNTER — Encounter: Payer: Self-pay | Admitting: Obstetrics and Gynecology

## 2017-02-24 ENCOUNTER — Encounter: Payer: Self-pay | Admitting: Obstetrics and Gynecology

## 2017-02-24 ENCOUNTER — Ambulatory Visit (INDEPENDENT_AMBULATORY_CARE_PROVIDER_SITE_OTHER): Payer: BLUE CROSS/BLUE SHIELD | Admitting: Obstetrics and Gynecology

## 2017-02-24 VITALS — BP 100/56 | HR 74 | Wt 157.0 lb

## 2017-02-24 DIAGNOSIS — N912 Amenorrhea, unspecified: Secondary | ICD-10-CM

## 2017-02-24 DIAGNOSIS — Z113 Encounter for screening for infections with a predominantly sexual mode of transmission: Secondary | ICD-10-CM

## 2017-02-24 DIAGNOSIS — Z3A01 Less than 8 weeks gestation of pregnancy: Secondary | ICD-10-CM

## 2017-02-24 DIAGNOSIS — Z3480 Encounter for supervision of other normal pregnancy, unspecified trimester: Secondary | ICD-10-CM | POA: Insufficient documentation

## 2017-02-24 LAB — POCT URINE PREGNANCY: PREG TEST UR: POSITIVE — AB

## 2017-02-24 NOTE — Progress Notes (Signed)
New Obstetric Patient H&P    Chief Complaint: "Desires prenatal care"   History of Present Illness: Patient is a 32 y.o. G1P1001 Not Hispanic or Latino female, Patient's last menstrual period was 01/10/2017 (exact date). presents with amenorrhea and positive home pregnancy test. Based on her  LMP, her Estimated Date of Delivery: 10/17/17 with estimated gestational age of76w3d. Cycles are regular, this was a planned pregnancy.  Her last pap smear was 03/29/2016 and was NIL HPV negative.    She had a urine pregnancy test which was positive 1 week(s)  ago. Her last menstrual period was normal and lasted for  5 day(s). Since her LMP she claims she has experienced nausea, no emesis, breast tenderness, and fatigue. She denies vaginal bleeding. Her past medical history is noncontributory. She is strictly vegan.  Her prior pregnancies are notable for none.    Since her LMP, she admits to the use of tobacco products  no She claims she has gained   no pounds since the start of her pregnancy.  There are cats in the home in the home  Yes, indoor and outdoor She admits close contact with children on a regular basis  yes  She has had chicken pox in the past yesindo She has had Tuberculosis exposures, symptoms, or previously tested positive for TB   no Current or past history of domestic violence. no  Genetic Screening/Teratology Counseling: (Includes patient, baby's father, or anyone in either family with:)   1. Patient's age >/= 74 [facility]  no 2. Thalassemia ([place], [place], Mediterranean, or Asian background): MCV<80  no 3. Neural tube defect (meningomyelocele, spina bifida, anencephaly)  Her brother was diagnosed with possible spina bifida occult at age 63, never had MRI, may in fact have been pilonidal cyst 4. Congenital heart defect  yes  5. Down syndrome  no 6. Tay-Sachs (Jewish, [place]  no 7. Canavan's Disease  no 8. Sickle cell disease or trait (African)  no 9. Hemophilia or other  blood disorders  no 10. Muscular dystrophy  no  11. Cystic fibrosis  no  12. Huntington's Chorea  no  13. Mental retardation/autism  no 14. Other inherited genetic or chromosomal disorder  no 15. Maternal metabolic disorder (DM, PKU, etc)  no 16. Patient or FOB with a child with a birth defect not listed above no  16a. Patient or FOB with a birth defect themselves no 17. Recurrent pregnancy loss, or stillbirth  no  18. Any medications since LMP other than prenatal vitamins (include vitamins, supplements, OTC meds, drugs, alcohol)  no 19. Any other genetic/environmental exposure to discuss  no  Infection History:   1. Lives with someone with TB or TB exposed  no  2. Patient or partner has history of genital herpes  no 3. Rash or viral illness since LMP  no 4. History of STI (GC, CT, HPV, syphilis, HIV)  no 5. History of recent travel :  no  Other pertinent information:  no     Review of Systems:10 point review of systems negative unless otherwise noted in HPI  Past Medical History:  Past Medical History:  Diagnosis Date  . Alpha-1-antitrypsin deficiency (HCC)   . Anxiety   . GERD (gastroesophageal reflux disease)   . Hemorrhoids   . Mitral valve prolapse     Past Surgical History:  Past Surgical History:  Procedure Laterality Date  . ESOPHAGOGASTRODUODENOSCOPY ENDOSCOPY      Gynecologic History: Patient's last menstrual period was 01/10/2017 (exact date).  Obstetric History: G1P1001  Family History:  Family History  Problem Relation Age of Onset  . Hypertension Father   . Prostate cancer Paternal Uncle   . Breast cancer Other   . Breast cancer Maternal Aunt 52       stage 0    Social History:  Social History   Socioeconomic History  . Marital status: Married    Spouse name: Not on file  . Number of children: Not on file  . Years of education: Not on file  . Highest education level: Not on file  Social Needs  . Financial resource strain: Not on file    . Food insecurity - worry: Not on file  . Food insecurity - inability: Not on file  . Transportation needs - medical: Not on file  . Transportation needs - non-medical: Not on file  Occupational History  . Not on file  Tobacco Use  . Smoking status: Never Smoker  . Smokeless tobacco: Never Used  Substance and Sexual Activity  . Alcohol use: No  . Drug use: No  . Sexual activity: Yes    Birth control/protection: None  Other Topics Concern  . Not on file  Social History Narrative  . Not on file    Allergies:  Allergies  Allergen Reactions  . Clarithromycin Hives  . Robitussin (Alcohol Free) [Guaifenesin] Hives  . Sulfa Antibiotics Hives    Medications: Prior to Admission medications   Medication Sig Start Date End Date Taking? Authorizing Provider  B Complex Vitamins (VITAMIN B COMPLEX PO) Take by mouth.    [provider]  Cholecalciferol (VITAMIN D3) 1000 units CAPS Take 1 capsule by mouth daily.    [provider]  omega-3 acid ethyl esters (LOVAZA) 1 g capsule Take by mouth.    [provider]  Prenatal Ca Carb-B6-B12-FA (BP FOLINATAL PLUS B) 1 MG TABS Take by mouth.    [provider]    Physical Exam Vitals: There were no vitals taken for this visit.  General: NAD HEENT: normocephalic, anicteric Thyroid: no enlargement, no palpable nodules Pulmonary: No increased work of breathing, CTAB Cardiovascular: RRR, distal pulses 2+ Abdomen: NABS, soft, non-tender, non-distended.  Umbilicus without lesions.  No hepatomegaly, splenomegaly or masses palpable. No evidence of hernia  Genitourinary:  External: Normal external female genitalia.  Normal urethral meatus, normal  Bartholin's and Skene's glands.    Vagina: Normal vaginal mucosa, no evidence of prolapse.    Cervix: Grossly normal in appearance, no bleeding  Uterus:  Non-enlarged, mobile, normal contour.  No CMT  Adnexa: ovaries non-enlarged, no adnexal masses  Rectal:  deferred Extremities: no edema, erythema, or tenderness Neurologic: Grossly intact Psychiatric: mood appropriate, affect full   Assessment: 32 y.o. G1P1001 at Unknown presenting to initiate prenatal care  Plan: 1) Avoid alcoholic beverages. 2) Patient encouraged not to smoke.  3) Discontinue the use of all non-medicinal drugs and chemicals.  4) Take prenatal vitamins daily.  5) Nutrition, food safety (fish, cheese advisories, and high nitrite foods) and exercise discussed. 6) Hospital and practice style discussed with cross coverage system.  7) Genetic Screening, such as with 1st Trimester Screening, cell free fetal DNA, AFP testing, and Ultrasound, as well as with amniocentesis and CVS as appropriate, is discussed with patient. At the conclusion of today's visit patient requested genetic testing - desires 1st trimester screening, declines carrier testing 8) Patient is asked about travel to areas at risk for the Zika virus, and counseled to avoid travel and exposure  to mosquitoes or sexual partners who may have themselves been exposed to the virus. Testing is discussed, and will be ordered as appropriate.   Vena [place], MD, [facility], [facility] 02/24/2017, 2:43 PM

## 2017-02-24 NOTE — Progress Notes (Signed)
NOB 

## 2017-02-25 ENCOUNTER — Encounter: Payer: Self-pay | Admitting: Obstetrics and Gynecology

## 2017-02-25 LAB — RPR+RH+ABO+RUB AB+AB SCR+CB...
ANTIBODY SCREEN: NEGATIVE
HEMATOCRIT: 37.8 % (ref 34.0–46.6)
HIV Screen 4th Generation wRfx: NONREACTIVE
Hemoglobin: 13.1 g/dL (ref 11.1–15.9)
Hepatitis B Surface Ag: NEGATIVE
MCH: 31 pg (ref 26.6–33.0)
MCHC: 34.7 g/dL (ref 31.5–35.7)
MCV: 89 fL (ref 79–97)
Platelets: 273 10*3/uL (ref 150–379)
RBC: 4.23 x10E6/uL (ref 3.77–5.28)
RDW: 13.5 % (ref 12.3–15.4)
RH TYPE: POSITIVE
RPR Ser Ql: NONREACTIVE
Rubella Antibodies, IGG: 1.2 index (ref >0.99)
Varicella zoster IgG: 2770 index (ref >165)
WBC: 7.4 10*3/uL (ref 3.4–10.8)

## 2017-02-26 LAB — GC/CHLAMYDIA PROBE AMP
Chlamydia trachomatis, NAA: NEGATIVE
Neisseria gonorrhoeae by PCR: NEGATIVE

## 2017-02-26 LAB — URINE CULTURE: Organism ID, Bacteria: NO GROWTH

## 2017-03-03 ENCOUNTER — Ambulatory Visit (INDEPENDENT_AMBULATORY_CARE_PROVIDER_SITE_OTHER): Payer: BLUE CROSS/BLUE SHIELD

## 2017-03-03 ENCOUNTER — Encounter: Payer: Self-pay | Admitting: Obstetrics & Gynecology

## 2017-03-03 ENCOUNTER — Ambulatory Visit (INDEPENDENT_AMBULATORY_CARE_PROVIDER_SITE_OTHER): Payer: BLUE CROSS/BLUE SHIELD | Admitting: Obstetrics & Gynecology

## 2017-03-03 VITALS — BP 112/70 | Wt 159.0 lb

## 2017-03-03 DIAGNOSIS — Z3480 Encounter for supervision of other normal pregnancy, unspecified trimester: Secondary | ICD-10-CM | POA: Diagnosis not present

## 2017-03-03 DIAGNOSIS — Z1371 Encounter for nonprocreative screening for genetic disease carrier status: Secondary | ICD-10-CM

## 2017-03-03 DIAGNOSIS — Z3A01 Less than 8 weeks gestation of pregnancy: Secondary | ICD-10-CM

## 2017-03-03 NOTE — Progress Notes (Signed)
  Subjective  Fetal Movement? no Contractions? no Leaking Fluid? no Vaginal Bleeding? No Mild nausea  Objective  BP 112/70   Wt 159 lb (72.1 kg)   LMP 01/10/2017 (Exact Date)   BMI 25.66 kg/m  General: NAD Pumonary: no increased work of breathing Abdomen: gravid, non-tender Extremities: no edema Psychiatric: mood appropriate, affect full  Assessment  32 y.o. G2P1001 at 9138w3d by  10/17/2017, by Last Menstrual Period presenting for routine prenatal visit  Plan   Problem List Items Addressed This Visit      Other   Supervision of other normal pregnancy, antepartum    Other Visit Diagnoses    Screening for genetic disease carrier status    -  Primary   Relevant Orders   US Fetal Nuchal Translucency Measurement   Inheritest Society Guided   [redacted] weeks gestation of pregnancy        Review of ULTRASOUND.    I have personally reviewed images and report of recent [facility].    Plan of management to be discussed with patient. First screen and considering carrier status labs nv,   Annamarie MajorPaul Creighton Longley, MD, Merlinda FrederickFACOG [facility], [facility] 03/03/2017  3:42 PM

## 2017-03-03 NOTE — Patient Instructions (Signed)

## 2017-03-31 ENCOUNTER — Encounter: Payer: Self-pay | Admitting: Obstetrics and Gynecology

## 2017-03-31 ENCOUNTER — Ambulatory Visit (INDEPENDENT_AMBULATORY_CARE_PROVIDER_SITE_OTHER): Payer: BLUE CROSS/BLUE SHIELD

## 2017-03-31 ENCOUNTER — Ambulatory Visit (INDEPENDENT_AMBULATORY_CARE_PROVIDER_SITE_OTHER): Payer: BLUE CROSS/BLUE SHIELD | Admitting: Obstetrics and Gynecology

## 2017-03-31 VITALS — BP 114/76 | Wt 166.0 lb

## 2017-03-31 DIAGNOSIS — Z3682 Encounter for antenatal screening for nuchal translucency: Secondary | ICD-10-CM

## 2017-03-31 DIAGNOSIS — Z1371 Encounter for nonprocreative screening for genetic disease carrier status: Secondary | ICD-10-CM | POA: Diagnosis not present

## 2017-03-31 DIAGNOSIS — Z3480 Encounter for supervision of other normal pregnancy, unspecified trimester: Secondary | ICD-10-CM

## 2017-03-31 DIAGNOSIS — Z3A11 11 weeks gestation of pregnancy: Secondary | ICD-10-CM

## 2017-03-31 DIAGNOSIS — Z1379 Encounter for other screening for genetic and chromosomal anomalies: Secondary | ICD-10-CM

## 2017-03-31 NOTE — Progress Notes (Signed)
  Routine Prenatal Care Visit  Subjective  Gabrielle Mcdowell is a 32 y.o. G2P1001 at 9282w3d being seen today for ongoing prenatal care.  She is currently monitored for the following issues for this low-risk pregnancy and has Mitral valve prolapse; RLQ abdominal pain; Suprapubic pain; Galactorrhea; Cyst of right ovary; Mass of upper inner quadrant of left breast; and Supervision of other normal pregnancy, antepartum on their problem list.  ----------------------------------------------------------------------------------- Patient reports no complaints.    . Vag. Bleeding: None.  Movement: Absent. Denies leaking of fluid.  ----------------------------------------------------------------------------------- The following portions of the patient's history were reviewed and updated as appropriate: allergies, current medications, past family history, past medical history, past social history, past surgical history and problem list. Problem list updated.   Objective  Blood pressure 114/76, weight 166 lb (75.3 kg), last menstrual period 01/10/2017. Pregravid weight 150 lb (68 kg) Total Weight Gain 16 lb (7.258 kg) Urinalysis:      Fetal Status: Fetal Heart Rate (bpm): present   Movement: Absent     General:  Alert, oriented and cooperative. Patient is in no acute distress.  Skin: Skin is warm and dry. No rash noted.   Cardiovascular: Normal heart rate noted  Respiratory: Normal respiratory effort, no problems with respiration noted  Abdomen: Soft, gravid, appropriate for gestational age. Pain/Pressure: Absent     Pelvic:  Cervical exam deferred        Extremities: Normal range of motion.     Mental Status: Normal mood and affect. Normal behavior. Normal judgment and thought content.   Assessment   32 y.o. G2P1001 at 7082w3d by  10/17/2017, by Last Menstrual Period presenting for routine prenatal visit  Plan   Pregnancy#2 Problems (from 01/10/17 to present)    No problems associated with this  episode.       Preterm labor symptoms and general obstetric precautions including but not limited to vaginal bleeding, contractions, leaking of fluid and fetal movement were reviewed in detail with the patient. Please refer to After Visit Summary for other counseling recommendations.  -NT u/s and labs today  Return in about 4 weeks (around 04/28/2017) for Routine Prenatal Appointment.  Thomasene MohairStephen Murry Khiev, MD, Merlinda FrederickFACOG [facility], [facility] 03/31/2017 3:58 PM

## 2017-04-02 LAB — FIRST TRIMESTER SCREEN W/NT
CRL: 47.5 mm
DIA MOM: 0.61
DIA VALUE: 154.6 pg/mL
Gest Age-Collect: 11.3 weeks
Maternal Age At EDD: 32.7 yr
NUCHAL TRANSLUCENCY MOM: 0.76
Nuchal Translucency: 0.8 mm
Number of Fetuses: 1
PAPP-A MOM: 0.43
PAPP-A VALUE: 239.5 ng/mL
Test Results:: NEGATIVE
WEIGHT: 166 [lb_av]
hCG MoM: 0.5
hCG Value: 48.3 IU/mL

## 2017-04-08 ENCOUNTER — Encounter: Payer: Self-pay | Admitting: Obstetrics and Gynecology

## 2017-04-12 ENCOUNTER — Encounter: Payer: Self-pay | Admitting: Obstetrics & Gynecology

## 2017-04-14 ENCOUNTER — Other Ambulatory Visit: Payer: BLUE CROSS/BLUE SHIELD

## 2017-04-14 ENCOUNTER — Encounter: Payer: Self-pay | Admitting: Obstetrics & Gynecology

## 2017-04-14 ENCOUNTER — Other Ambulatory Visit: Payer: Self-pay | Admitting: Obstetrics & Gynecology

## 2017-04-14 DIAGNOSIS — E569 Vitamin deficiency, unspecified: Secondary | ICD-10-CM

## 2017-04-14 NOTE — Telephone Encounter (Signed)
Lab appt needed for patient.  Orders in.

## 2017-04-15 LAB — VITAMIN B12: VITAMIN B 12: 268 pg/mL (ref 232–1245)

## 2017-04-29 ENCOUNTER — Encounter: Payer: BLUE CROSS/BLUE SHIELD | Admitting: Obstetrics and Gynecology

## 2017-04-30 ENCOUNTER — Ambulatory Visit (INDEPENDENT_AMBULATORY_CARE_PROVIDER_SITE_OTHER): Payer: BLUE CROSS/BLUE SHIELD | Admitting: Certified Nurse Midwife

## 2017-04-30 VITALS — BP 102/62 | Wt 169.0 lb

## 2017-04-30 DIAGNOSIS — Z3480 Encounter for supervision of other normal pregnancy, unspecified trimester: Secondary | ICD-10-CM

## 2017-04-30 DIAGNOSIS — Z1379 Encounter for other screening for genetic and chromosomal anomalies: Secondary | ICD-10-CM

## 2017-04-30 DIAGNOSIS — Z3A15 15 weeks gestation of pregnancy: Secondary | ICD-10-CM

## 2017-04-30 NOTE — Progress Notes (Signed)
Pt reports no problems.   

## 2017-04-30 NOTE — Progress Notes (Signed)
ROB at 15wk5days: Doing well. Is a vegan and is taking vitamin B12 supplements Is detecting a vaginal odor and would like to be checked for a vaginitis.  Feeling flutters. Desires screening for NTD. First trimester test negative Exam: FH at 1/2 between U and SP +1FB. FHTs WNL. Wet prep was negative for Trich, clue cells and hyphae. No amine odor A: IUP at 15wk5days S=D P: MSAFP today RTO in 4 weeks for anatomy scan

## 2017-05-01 ENCOUNTER — Encounter: Payer: Self-pay | Admitting: Obstetrics and Gynecology

## 2017-05-03 ENCOUNTER — Encounter (INDEPENDENT_AMBULATORY_CARE_PROVIDER_SITE_OTHER): Payer: Self-pay

## 2017-05-03 LAB — AFP, SERUM, OPEN SPINA BIFIDA
AFP MoM: 0.59
AFP VALUE AFPOSL: 16 ng/mL
GEST. AGE ON COLLECTION DATE: 15.5 wk
Maternal Age At EDD: 32.6 yr
OSBR RISK 1 IN: 10000
Test Results:: NEGATIVE
WEIGHT: 169 [lb_av]

## 2017-05-09 ENCOUNTER — Encounter: Payer: Self-pay | Admitting: Obstetrics and Gynecology

## 2017-05-28 ENCOUNTER — Ambulatory Visit (INDEPENDENT_AMBULATORY_CARE_PROVIDER_SITE_OTHER): Payer: BLUE CROSS/BLUE SHIELD

## 2017-05-28 ENCOUNTER — Ambulatory Visit (INDEPENDENT_AMBULATORY_CARE_PROVIDER_SITE_OTHER): Payer: BLUE CROSS/BLUE SHIELD | Admitting: Obstetrics and Gynecology

## 2017-05-28 VITALS — BP 108/60 | Wt 176.0 lb

## 2017-05-28 DIAGNOSIS — Z3A19 19 weeks gestation of pregnancy: Secondary | ICD-10-CM

## 2017-05-28 DIAGNOSIS — Z3480 Encounter for supervision of other normal pregnancy, unspecified trimester: Secondary | ICD-10-CM

## 2017-05-28 DIAGNOSIS — Z0489 Encounter for examination and observation for other specified reasons: Secondary | ICD-10-CM

## 2017-05-28 DIAGNOSIS — IMO0002 Reserved for concepts with insufficient information to code with codable children: Secondary | ICD-10-CM

## 2017-05-28 NOTE — Progress Notes (Signed)
ROB Anatomy Scan 

## 2017-05-30 NOTE — Progress Notes (Signed)
Routine Prenatal Care Visit  Subjective  Gabrielle Mcdowell is a 32 y.o. G2P1001 at [redacted]w[redacted]d being seen today for ongoing prenatal care.  She is currently monitored for the following issues for this low-risk pregnancy and has Mitral valve prolapse; RLQ abdominal pain; Suprapubic pain; Galactorrhea; Cyst of right ovary; Mass of upper inner quadrant of left breast; and Supervision of other normal pregnancy, antepartum on their problem list.  ----------------------------------------------------------------------------------- Patient reports no complaints.   Contractions: Not present. Vag. Bleeding: None.  Movement: Present. Denies leaking of fluid.  ----------------------------------------------------------------------------------- The following portions of the patient's history were reviewed and updated as appropriate: allergies, current medications, past family history, past medical history, past social history, past surgical history and problem list. Problem list updated.   Objective  Blood pressure 108/60, weight 176 lb (79.8 kg), last menstrual period 01/10/2017. Pregravid weight 150 lb (68 kg) Total Weight Gain 26 lb (11.8 kg) Urinalysis:      Fetal Status: Fetal Heart Rate (bpm): 145   Movement: Present     General:  Alert, oriented and cooperative. Patient is in no acute distress.  Skin: Skin is warm and dry. No rash noted.   Cardiovascular: Normal heart rate noted  Respiratory: Normal respiratory effort, no problems with respiration noted  Abdomen: Soft, gravid, appropriate for gestational age. Pain/Pressure: Present     Pelvic:  Cervical exam deferred        Extremities: Normal range of motion.     ental Status: Normal mood and affect. Normal behavior. Normal judgment and thought content.   US Ob Comp + 14 Wk  Result Date: 05/30/2017 ULTRASOUND REPORT Patient Name: Gabrielle Mcdowell DOB: [birth year] MRN: [id number] Location: [facility] Date of Service: 05/28/2017  Indications:Anatomy U/S Findings: Mason Jim intrauterine pregnancy is visualized with FHR at 143 BPM. Biometrics give an (U/S) Gestational age of [redacted]w[redacted]d and an (U/S) EDD of 10/16/2017; this correlates with the clinically established Estimated Date of Delivery: 10/17/17 Fetal presentation is Cephalic. EFW: 12 oz. Placenta: Anterior, grade 0. AFI: subjectively normal. Anatomic survey is incomplete for RVOT, LVOT, diaphragm and suboptimal profile and normal; Gender - female.  Left EICF Right Ovary is normal in appearance. Left Ovary is normal appearance. Survey of the adnexa demonstrates no adnexal masses. There is no free peritoneal fluid in the cul de sac. Impression: 1. [redacted]w[redacted]d Viable Singleton Intrauterine pregnancy by U/S. 2. (U/S) EDD is consistent with Clinically established Estimated Date of Delivery: 10/17/17 . 3. Normal Anatomy Scan Recommendations: 1.Clinical correlation with the patient's History and Physical Exam. 2. Follow up in 4 weeks to complete Anatomic Survey. Willette Alma, RDMS, RVT  There is a singleton gestation with subjectively normal amniotic fluid volume. The fetal biometry correlates with established dating. Detailed evaluation of the fetal anatomy was performed.The fetal anatomical survey appears within normal limits within the resolution of ultrasound as described above, and remains incomplete for outflow tracts, diaphragm, and profile.  It must be noted that a normal ultrasound is unable to rule out fetal aneuploidy.  Vena Austria, MD, Evern Core [facility], [facility] 05/30/2017, 1:36 PM     Assessment   32 y.o. G2P1001 at [redacted]w[redacted]d by  10/17/2017, by Last Menstrual Period presenting for routine prenatal visit  Plan    Gestational age appropriate obstetric precautions including but not limited to vaginal bleeding, contractions, leaking of fluid and fetal movement were reviewed in detail with the patient.   - anatomy scan incomplete   Return in about 1 month  (  around 06/25/2017) for ROB and follow up anatomy scan.  Vena [place], MD, [facility], [facility]

## 2017-06-11 ENCOUNTER — Telehealth: Payer: Self-pay | Admitting: Obstetrics and Gynecology

## 2017-06-11 NOTE — Telephone Encounter (Signed)
Unable to reach patient. Mailbox is full so could not leave message

## 2017-06-11 NOTE — Telephone Encounter (Signed)
Caller States she is [redacted] weeks Pregnant. Was rubella resistant with First pregnancy, received when leaving the hospital. 2nd pregnancy now was checked at 6 weeks and she was 1.2 on her level. Brother went on a cruise. Her brother now has a rash all over his chest. His rash is red, circular, dime sized. About 12 on his chest. Is there a chance that the immunity level could lower now and she wouldn't been protected. Patient wants to know if she can still be around her brother. Patient reports brother hasn't been treated for rash. And refuses to wait to be seen at an urgent care with people showing signs of flu like symptoms. Patient would like Dr. Bonney Aid to call to speak with her to advise what she needs to do.

## 2017-06-11 NOTE — Telephone Encounter (Signed)
No she was immune in February there is no data to support re-drawing titers 3 months later

## 2017-06-11 NOTE — Telephone Encounter (Signed)
Please advise 

## 2017-06-27 ENCOUNTER — Ambulatory Visit (INDEPENDENT_AMBULATORY_CARE_PROVIDER_SITE_OTHER): Payer: BLUE CROSS/BLUE SHIELD

## 2017-06-27 ENCOUNTER — Ambulatory Visit (INDEPENDENT_AMBULATORY_CARE_PROVIDER_SITE_OTHER): Payer: BLUE CROSS/BLUE SHIELD | Admitting: Obstetrics and Gynecology

## 2017-06-27 VITALS — BP 114/60 | Wt 183.0 lb

## 2017-06-27 DIAGNOSIS — Z3A24 24 weeks gestation of pregnancy: Secondary | ICD-10-CM | POA: Diagnosis not present

## 2017-06-27 DIAGNOSIS — Z3482 Encounter for supervision of other normal pregnancy, second trimester: Secondary | ICD-10-CM

## 2017-06-27 DIAGNOSIS — Z3480 Encounter for supervision of other normal pregnancy, unspecified trimester: Secondary | ICD-10-CM

## 2017-06-27 DIAGNOSIS — Z0489 Encounter for examination and observation for other specified reasons: Secondary | ICD-10-CM | POA: Diagnosis not present

## 2017-06-27 DIAGNOSIS — IMO0002 Reserved for concepts with insufficient information to code with codable children: Secondary | ICD-10-CM

## 2017-06-27 NOTE — Progress Notes (Signed)
ROB F/U anatomy scan 

## 2017-06-27 NOTE — Progress Notes (Signed)
    Routine Prenatal Care Visit  Subjective  Gabrielle Mcdowell is a 32 y.o. G2P1001 at 5219w0d being seen today for ongoing prenatal care.  She is currently monitored for the following issues for this high-risk pregnancy and has Mitral valve prolapse; RLQ abdominal pain; Suprapubic pain; Galactorrhea; Cyst of right ovary; Mass of upper inner quadrant of left breast; and Supervision of other normal pregnancy, antepartum on their problem list.  ----------------------------------------------------------------------------------- Patient reports no complaints.   Contractions: Not present. Vag. Bleeding: None.  Movement: Present. Denies leaking of fluid.  ----------------------------------------------------------------------------------- The following portions of the patient's history were reviewed and updated as appropriate: allergies, current medications, past family history, past medical history, past social history, past surgical history and problem list. Problem list updated.   Objective  Blood pressure 114/60, weight 183 lb (83 kg), last menstrual period 01/10/2017. Pregravid weight 150 lb (68 kg) Total Weight Gain 33 lb (15 kg) Urinalysis: Urine Protein: Negative Urine Glucose: Negative  Fetal Status: Fetal Heart Rate (bpm): 150   Movement: Present     General:  Alert, oriented and cooperative. Patient is in no acute distress.  Skin: Skin is warm and dry. No rash noted.   Cardiovascular: Normal heart rate noted  Respiratory: Normal respiratory effort, no problems with respiration noted  Abdomen: Soft, gravid, appropriate for gestational age. Pain/Pressure: Present     Pelvic:  Cervical exam deferred        Extremities: Normal range of motion.     ental Status: Normal mood and affect. Normal behavior. Normal judgment and thought content.     Assessment   32 y.o. G2P1001 at 5519w0d by  10/17/2017, by Last Menstrual Period presenting for routine prenatal visit  Plan   Pregnancy#2  Problems (from 01/10/17 to present)    Problem Noted Resolved   Supervision of other normal pregnancy, antepartum 02/24/2017 by Vena AustriaStaebler, Malayja Freund, MD No   Overview Signed 02/25/2017  1:45 PM by Vena AustriaStaebler, Caide Campi, MD    Clinic [facility] Prenatal Labs  Dating LMP = 7 week US Blood type: A positive  Genetic Screen 1 Screen: negative   AFP: negative Antibody:negative  Anatomic US Normal Female Rubella: Immune Varicella: Immune  GTT   RPR: NR  Rhogam N/A HBsAg: negative  TDaP vaccine                       Flu Shot: declines HIV: negative  Baby Food                                GBS:   Contraception  Pap: NIL HPV negative 03/29/2016  CBB   Pelvis tested to 3540g 7lbs 13oz  CS/VBAC    Support Person                Gestational age appropriate obstetric precautions including but not limited to vaginal bleeding, contractions, leaking of fluid and fetal movement were reviewed in detail with the patient.    Return in about 1 month (around 07/25/2017) for ROB 28 weeks labs.  Vena AustriaAndreas Jamieon Lannen, MD, Merlinda FrederickFACOG [facility], [facility] 06/30/2017, 5:31 PM

## 2017-07-03 ENCOUNTER — Telehealth: Payer: Self-pay

## 2017-07-03 NOTE — Telephone Encounter (Signed)
Pt c/o for the kast few days SOB, and dizziness.  Should she get checked for anemia? [phone] Pt states no chest pain, but her sxs seem to be getting worse.  She was anemic with last preg.  She is unable to sit c feet up d/t toddler.  Adv SOB probably from baby's position and as for the dizziness adv to move slower as there is more blood to be pumped around.  She is wondering if she could have an order for blood work to check for anemia.  Adv AMS on call, will send him msg. Pt can be reached at same number.

## 2017-07-03 NOTE — Telephone Encounter (Signed)
She probably needs to be seen, she was not anemia at there intake labs in February.  It really depends on degree of SOB and any other symptoms.  We generally look at it again at 28 weeks.  If she is having chest pain or swelling in a leg might be a blood clot too which needs to be evaluated in office

## 2017-07-04 ENCOUNTER — Encounter: Payer: Self-pay | Admitting: Obstetrics and Gynecology

## 2017-07-04 ENCOUNTER — Ambulatory Visit (INDEPENDENT_AMBULATORY_CARE_PROVIDER_SITE_OTHER): Payer: BLUE CROSS/BLUE SHIELD | Admitting: Obstetrics & Gynecology

## 2017-07-04 VITALS — BP 120/80 | HR 119 | Wt 183.0 lb

## 2017-07-04 DIAGNOSIS — D649 Anemia, unspecified: Secondary | ICD-10-CM | POA: Diagnosis not present

## 2017-07-04 NOTE — Telephone Encounter (Signed)
[facility]

## 2017-07-04 NOTE — Telephone Encounter (Signed)
Called and left voicemail for patient to call back  To be schedule

## 2017-07-04 NOTE — Progress Notes (Signed)
  Pt reports postural dizziness and seeing spots at times this week.  Anemia last pregnancy.  No bleeding.  Good FM.   PMHx: She  has a past medical history of Alpha-1-antitrypsin deficiency (HCC), Anxiety, GERD (gastroesophageal reflux disease), Hemorrhoids, and Mitral valve prolapse. Also,  has a past surgical history that includes Esophagogastroduodenoscopy endoscopy., family history includes Breast cancer in her other; Breast cancer (age of onset: 8352) in her maternal aunt; Hypertension in her father; Prostate cancer in her paternal uncle.,  reports that she has never smoked. She has never used smokeless tobacco. She reports that she does not drink alcohol or use drugs.  She has a current medication list which includes the following prescription(s): b complex vitamins, vitamin d3, omega-3 acid ethyl esters, and bp folinatal plus b. Also, is allergic to brompheniramine-pseudoeph; clarithromycin; robitussin (alcohol free) [guaifenesin]; and sulfa antibiotics.  Review of Systems  Constitutional: Negative for chills, fever and malaise/fatigue.  HENT: Negative for congestion, sinus pain and sore throat.   Eyes: Negative for blurred vision and pain.  Respiratory: Negative for cough and wheezing.   Cardiovascular: Negative for chest pain and leg swelling.  Gastrointestinal: Negative for abdominal pain, constipation, diarrhea, heartburn, nausea and vomiting.  Genitourinary: Negative for dysuria, frequency, hematuria and urgency.  Musculoskeletal: Negative for back pain, joint pain, myalgias and neck pain.  Skin: Negative for itching and rash.  Neurological: Positive for dizziness. Negative for tremors and weakness.  Endo/Heme/Allergies: Does not bruise/bleed easily.  Psychiatric/Behavioral: Negative for depression. The patient is not nervous/anxious and does not have insomnia.    Objective: BP 120/80   Pulse (!) 119   Wt 183 lb (83 kg)   LMP 01/10/2017 (Exact Date)   BMI 29.54 kg/m  Physical  Exam  Constitutional: She is oriented to person, place, and time. She appears well-developed and well-nourished. No distress.  Cardiovascular: Normal rate, regular rhythm and normal pulses. Exam reveals no gallop and no friction rub.  No murmur heard. HR 80 both lying and sitting  Pulmonary/Chest: Effort normal and breath sounds normal.  Abdominal: Normal appearance. There is no tenderness.  Gravid FHT 140s  Musculoskeletal: Normal range of motion.  Neurological: She is alert and oriented to person, place, and time.  Skin: Skin is warm and dry.  Psychiatric: She has a normal mood and affect.  Vitals reviewed.  ASSESSMENT/PLAN:  New problem/ work up  Anemia, unspecified type    -  Primary   Relevant Orders   CBC  Fluids Pregnancy can cause some of these sx's.    Monitor for other sx's/ etiology   Annamarie MajorPaul Jacere Pangborn, MD, Merlinda FrederickFACOG [facility], [facility] 07/04/2017  2:42 PM

## 2017-07-05 LAB — CBC
Hematocrit: 33.3 % — ABNORMAL LOW (ref 34.0–46.6)
Hemoglobin: 11.3 g/dL (ref 11.1–15.9)
MCH: 30.7 pg (ref 26.6–33.0)
MCHC: 33.9 g/dL (ref 31.5–35.7)
MCV: 91 fL (ref 79–97)
PLATELETS: 261 10*3/uL (ref 150–450)
RBC: 3.68 x10E6/uL — ABNORMAL LOW (ref 3.77–5.28)
RDW: 12.2 % — AB (ref 12.3–15.4)
WBC: 11.1 10*3/uL — ABNORMAL HIGH (ref 3.4–10.8)

## 2017-07-08 ENCOUNTER — Telehealth: Payer: Self-pay

## 2017-07-08 NOTE — Telephone Encounter (Signed)
[place]  [phone]

## 2017-07-09 ENCOUNTER — Encounter: Payer: Self-pay | Admitting: Obstetrics and Gynecology

## 2017-07-09 ENCOUNTER — Other Ambulatory Visit: Payer: Self-pay | Admitting: Obstetrics and Gynecology

## 2017-07-09 MED ORDER — FUSION 65-65-25-30 MG PO CAPS: 1.0000 | ORAL_CAPSULE | Freq: Every day | ORAL | 11 refills | Status: DC

## 2017-07-09 NOTE — Telephone Encounter (Signed)
You can let pt know she is slightly anemic. Cont PNVs and can add OTC Fe supp. Make sure to stay hydrated with lots of water. [facility] can f/u with her once he returns.

## 2017-07-09 NOTE — Telephone Encounter (Signed)
Pt aware.

## 2017-07-09 NOTE — Telephone Encounter (Signed)
lmtrc

## 2017-07-09 NOTE — Telephone Encounter (Signed)
Should I let pt know that [facility] is out of the office this week? Or can you go over these labs with her?

## 2017-07-24 ENCOUNTER — Encounter: Payer: Self-pay | Admitting: Maternal Newborn

## 2017-07-24 ENCOUNTER — Other Ambulatory Visit: Payer: BLUE CROSS/BLUE SHIELD

## 2017-07-24 ENCOUNTER — Ambulatory Visit (INDEPENDENT_AMBULATORY_CARE_PROVIDER_SITE_OTHER): Payer: BLUE CROSS/BLUE SHIELD | Admitting: Maternal Newborn

## 2017-07-24 VITALS — BP 100/60 | Wt 189.0 lb

## 2017-07-24 DIAGNOSIS — Z3482 Encounter for supervision of other normal pregnancy, second trimester: Secondary | ICD-10-CM

## 2017-07-24 DIAGNOSIS — Z3480 Encounter for supervision of other normal pregnancy, unspecified trimester: Secondary | ICD-10-CM

## 2017-07-24 DIAGNOSIS — Z3A27 27 weeks gestation of pregnancy: Secondary | ICD-10-CM

## 2017-07-24 NOTE — Progress Notes (Signed)
C/o joint pain - pubic/groin; some leakage from breast; questions.rj

## 2017-07-24 NOTE — Progress Notes (Signed)
    Routine Prenatal Care Visit  Subjective  Gabrielle Mcdowell is a 32 y.o. G2P1001 at 3769w6d being seen today for ongoing prenatal care.  She is currently monitored for the following issues for this low-risk pregnancy and has Mitral valve prolapse; RLQ abdominal pain; Suprapubic pain; Galactorrhea; Cyst of right ovary; Mass of upper inner quadrant of left breast; and Supervision of other normal pregnancy, antepartum on their problem list.  ----------------------------------------------------------------------------------- Patient reports that her breasts have started leaking. She also has pain in her ligaments and joints in pelvic area and hips. Contractions: Not present. Vag. Bleeding: None.  Movement: Present. No leaking of fluid.  ----------------------------------------------------------------------------------- The following portions of the patient's history were reviewed and updated as appropriate: allergies, current medications, past family history, past medical history, past social history, past surgical history and problem list. Problem list updated.  Objective  Blood pressure 100/60, weight 189 lb (85.7 kg), last menstrual period 01/10/2017. Pregravid weight 150 lb (68 kg) Total Weight Gain 39 lb (17.7 kg) Urinalysis: Urine Protein: Negative Urine Glucose: Negative  Fetal Status: Fetal Heart Rate (bpm): 135 Fundal Height: 29 cm Movement: Present     General:  Alert, oriented and cooperative. Patient is in no acute distress.  Skin: Skin is warm and dry. No rash noted.   Cardiovascular: Normal heart rate noted  Respiratory: Normal respiratory effort, no problems with respiration noted  Abdomen: Soft, gravid, appropriate for gestational age. Pain/Pressure: Present     Pelvic:  Cervical exam deferred        Extremities: Normal range of motion.  Edema: None  Mental Status: Normal mood and affect. Normal behavior. Normal judgment and thought content.    Assessment   32 y.o.  G2P1001 at 4769w6d, EDD 10/17/2017 by Last Menstrual Period presenting for routine prenatal visit.  Plan   Pregnancy#2 Problems (from 01/10/17 to present)    Problem Noted Resolved   Supervision of other normal pregnancy, antepartum 02/24/2017 by Vena AustriaStaebler, Andreas, MD No   Overview Addendum 06/30/2017  5:32 PM by Vena AustriaStaebler, Andreas, MD    Clinic [facility] Prenatal Labs  Dating LMP = 7 week US Blood type: A positive  Genetic Screen 1 Screen: negative   AFP: negative    Antibody:negative  Anatomic US Normal Female Rubella: Immune Varicella: Immune  GTT  RPR: NR  Rhogam N/A HBsAg: negative  TDaP vaccine                       Flu Shot: declines HIV: negative  Baby Food                                GBS:   Contraception  Pap: NIL HPV negative 03/29/2016  CBB   Pelvis tested to 3540g 7lbs 13oz  CS/VBAC    Support Person             Discussed that it is normal to leak colostrum in the third trimester. Advised pregnancy support band to help with pain in pelvis/hips. We talked about safety of topical essential oils and substances found in personal care products.   Gestational age appropriate obstetric precautions were reviewed.  Please refer to After Visit Summary for other counseling recommendations.   Return in about 2 weeks (around 08/07/2017) for ROB.  Marcelyn BruinsJacelyn Darragh Nay, CNM 07/24/2017  3:17 PM

## 2017-07-24 NOTE — Patient Instructions (Signed)
Third Trimester of Pregnancy The third trimester is from week 28 through week 40 (months 7 through 9). The third trimester is a time when the unborn baby (fetus) is growing rapidly. At the end of the ninth month, the fetus is about 20 inches in length and weighs 6-10 pounds. Body changes during your third trimester Your body will continue to go through many changes during pregnancy. The changes vary from woman to woman. During the third trimester:  Your weight will continue to increase. You can expect to gain 25-35 pounds (11-16 kg) by the end of the pregnancy.  You may begin to get stretch marks on your hips, abdomen, and breasts.  You may urinate more often because the fetus is moving lower into your pelvis and pressing on your bladder.  You may develop or continue to have heartburn. This is caused by increased hormones that slow down muscles in the digestive tract.  You may develop or continue to have constipation because increased hormones slow digestion and cause the muscles that push waste through your intestines to relax.  You may develop hemorrhoids. These are swollen veins (varicose veins) in the rectum that can itch or be painful.  You may develop swollen, bulging veins (varicose veins) in your legs.  You may have increased body aches in the pelvis, back, or thighs. This is due to weight gain and increased hormones that are relaxing your joints.  You may have changes in your hair. These can include thickening of your hair, rapid growth, and changes in texture. Some women also have hair loss during or after pregnancy, or hair that feels dry or thin. Your hair will most likely return to normal after your baby is born.  Your breasts will continue to grow and they will continue to become tender. A yellow fluid (colostrum) may leak from your breasts. This is the first milk you are producing for your baby.  Your belly button may stick out.  You may notice more swelling in your hands,  face, or ankles.  You may have increased tingling or numbness in your hands, arms, and legs. The skin on your belly may also feel numb.  You may feel short of breath because of your expanding uterus.  You may have more problems sleeping. This can be caused by the size of your belly, increased need to urinate, and an increase in your body's metabolism.  You may notice the fetus "dropping," or moving lower in your abdomen (lightening).  You may have increased vaginal discharge.  You may notice your joints feel loose and you may have pain around your pelvic bone.  What to expect at prenatal visits You will have prenatal exams every 2 weeks until week 36. Then you will have weekly prenatal exams. During a routine prenatal visit:  You will be weighed to make sure you and the baby are growing normally.  Your blood pressure will be taken.  Your abdomen will be measured to track your baby's growth.  The fetal heartbeat will be listened to.  Any test results from the previous visit will be discussed.  You may have a cervical check near your due date to see if your cervix has softened or thinned (effaced).  You will be tested for Group B streptococcus. This happens between 35 and 37 weeks.  Your health care provider may ask you:  What your birth plan is.  How you are feeling.  If you are feeling the baby move.  If you have had   any abnormal symptoms, such as leaking fluid, bleeding, severe headaches, or abdominal cramping.  If you are using any tobacco products, including cigarettes, chewing tobacco, and electronic cigarettes.  If you have any questions.  Other tests or screenings that may be performed during your third trimester include:  Blood tests that check for low iron levels (anemia).  Fetal testing to check the health, activity level, and growth of the fetus. Testing is done if you have certain medical conditions or if there are problems during the  pregnancy.  Nonstress test (NST). This test checks the health of your baby to make sure there are no signs of problems, such as the baby not getting enough oxygen. During this test, a belt is placed around your belly. The baby is made to move, and its heart rate is monitored during movement.  What is false labor? False labor is a condition in which you feel small, irregular tightenings of the muscles in the womb (contractions) that usually go away with rest, changing position, or drinking water. These are called Braxton Hicks contractions. Contractions may last for hours, days, or even weeks before true labor sets in. If contractions come at regular intervals, become more frequent, increase in intensity, or become painful, you should see your health care provider. What are the signs of labor?  Abdominal cramps.  Regular contractions that start at 10 minutes apart and become stronger and more frequent with time.  Contractions that start on the top of the uterus and spread down to the lower abdomen and back.  Increased pelvic pressure and dull back pain.  A watery or bloody mucus discharge that comes from the vagina.  Leaking of amniotic fluid. This is also known as your "water breaking." It could be a slow trickle or a gush. Let your health care provider know if it has a color or strange odor. If you have any of these signs, call your health care provider right away, even if it is before your due date. Follow these instructions at home: Medicines  Follow your health care provider's instructions regarding medicine use. Specific medicines may be either safe or unsafe to take during pregnancy.  Take a prenatal vitamin that contains at least 600 micrograms (mcg) of folic acid.  If you develop constipation, try taking a stool softener if your health care provider approves. Eating and drinking  Eat a balanced diet that includes fresh fruits and vegetables, whole grains, good sources of protein  such as meat, eggs, or tofu, and low-fat dairy. Your health care provider will help you determine the amount of weight gain that is right for you.  Avoid raw meat and uncooked cheese. These carry germs that can cause birth defects in the baby.  If you have low calcium intake from food, talk to your health care provider about whether you should take a daily calcium supplement.  Eat four or five small meals rather than three large meals a day.  Limit foods that are high in fat and processed sugars, such as fried and sweet foods.  To prevent constipation: ? Drink enough fluid to keep your urine clear or pale yellow. ? Eat foods that are high in fiber, such as fresh fruits and vegetables, whole grains, and beans. Activity  Exercise only as directed by your health care provider. Most women can continue their usual exercise routine during pregnancy. Try to exercise for 30 minutes at least 5 days a week. Stop exercising if you experience uterine contractions.  Avoid heavy   lifting.  Do not exercise in extreme heat or humidity, or at high altitudes.  Wear low-heel, comfortable shoes.  Practice good posture.  You may continue to have sex unless your health care provider tells you otherwise. Relieving pain and discomfort  Take frequent breaks and rest with your legs elevated if you have leg cramps or low back pain.  Take warm sitz baths to soothe any pain or discomfort caused by hemorrhoids. Use hemorrhoid cream if your health care provider approves.  Wear a good support bra to prevent discomfort from breast tenderness.  If you develop varicose veins: ? Wear support pantyhose or compression stockings as told by your healthcare provider. ? Elevate your feet for 15 minutes, 3-4 times a day. Prenatal care  Write down your questions. Take them to your prenatal visits.  Keep all your prenatal visits as told by your health care provider. This is important. Safety  Wear your seat belt at  all times when driving.  Make a list of emergency phone numbers, including numbers for family, friends, the hospital, and police and fire departments. General instructions  Avoid cat litter boxes and soil used by cats. These carry germs that can cause birth defects in the baby. If you have a cat, ask someone to clean the litter box for you.  Do not travel far distances unless it is absolutely necessary and only with the approval of your health care provider.  Do not use hot tubs, steam rooms, or saunas.  Do not drink alcohol.  Do not use any products that contain nicotine or tobacco, such as cigarettes and e-cigarettes. If you need help quitting, ask your health care provider.  Do not use any medicinal herbs or unprescribed drugs. These chemicals affect the formation and growth of the baby.  Do not douche or use tampons or scented sanitary pads.  Do not cross your legs for long periods of time.  To prepare for the arrival of your baby: ? Take prenatal classes to understand, practice, and ask questions about labor and delivery. ? Make a trial run to the hospital. ? Visit the hospital and tour the maternity area. ? Arrange for maternity or paternity leave through employers. ? Arrange for family and friends to take care of pets while you are in the hospital. ? Purchase a rear-facing car seat and make sure you know how to install it in your car. ? Pack your hospital bag. ? Prepare the baby's nursery. Make sure to remove all pillows and stuffed animals from the baby's crib to prevent suffocation.  Visit your dentist if you have not gone during your pregnancy. Use a soft toothbrush to brush your teeth and be gentle when you floss. Contact a health care provider if:  You are unsure if you are in labor or if your water has broken.  You become dizzy.  You have mild pelvic cramps, pelvic pressure, or nagging pain in your abdominal area.  You have lower back pain.  You have persistent  nausea, vomiting, or diarrhea.  You have an unusual or bad smelling vaginal discharge.  You have pain when you urinate. Get help right away if:  Your water breaks before 37 weeks.  You have regular contractions less than 5 minutes apart before 37 weeks.  You have a fever.  You are leaking fluid from your vagina.  You have spotting or bleeding from your vagina.  You have severe abdominal pain or cramping.  You have rapid weight loss or weight gain.    You have shortness of breath with chest pain.  You notice sudden or extreme swelling of your face, hands, ankles, feet, or legs.  Your baby makes fewer than 10 movements in 2 hours.  You have severe headaches that do not go away when you take medicine.  You have vision changes. Summary  The third trimester is from week 28 through week 40, months 7 through 9. The third trimester is a time when the unborn baby (fetus) is growing rapidly.  During the third trimester, your discomfort may increase as you and your baby continue to gain weight. You may have abdominal, leg, and back pain, sleeping problems, and an increased need to urinate.  During the third trimester your breasts will keep growing and they will continue to become tender. A yellow fluid (colostrum) may leak from your breasts. This is the first milk you are producing for your baby.  False labor is a condition in which you feel small, irregular tightenings of the muscles in the womb (contractions) that eventually go away. These are called Braxton Hicks contractions. Contractions may last for hours, days, or even weeks before true labor sets in.  Signs of labor can include: abdominal cramps; regular contractions that start at 10 minutes apart and become stronger and more frequent with time; watery or bloody mucus discharge that comes from the vagina; increased pelvic pressure and dull back pain; and leaking of amniotic fluid. This information is not intended to replace advice  given to you by your health care provider. Make sure you discuss any questions you have with your health care provider. Document Released: 12/25/2000 Document Revised: 06/08/2015 Document Reviewed: 03/03/2012 Elsevier Interactive Patient Education  2017 [employer]  

## 2017-07-25 LAB — 28 WEEK RH+PANEL
BASOS ABS: 0 10*3/uL (ref 0.0–0.2)
Basos: 0 %
EOS (ABSOLUTE): 0.2 10*3/uL (ref 0.0–0.4)
EOS: 2 %
Gestational Diabetes Screen: 117 mg/dL (ref 65–139)
HIV Screen 4th Generation wRfx: NONREACTIVE
Hematocrit: 33.4 % — ABNORMAL LOW (ref 34.0–46.6)
Hemoglobin: 11.4 g/dL (ref 11.1–15.9)
Immature Grans (Abs): 0.1 10*3/uL (ref 0.0–0.1)
Immature Granulocytes: 1 %
LYMPHS ABS: 1.5 10*3/uL (ref 0.7–3.1)
Lymphs: 15 %
MCH: 30.4 pg (ref 26.6–33.0)
MCHC: 34.1 g/dL (ref 31.5–35.7)
MCV: 89 fL (ref 79–97)
Monocytes Absolute: 0.8 10*3/uL (ref 0.1–0.9)
Monocytes: 8 %
Neutrophils Absolute: 7.4 10*3/uL — ABNORMAL HIGH (ref 1.4–7.0)
Neutrophils: 74 %
PLATELETS: 245 10*3/uL (ref 150–450)
RBC: 3.75 x10E6/uL — AB (ref 3.77–5.28)
RDW: 13.4 % (ref 12.3–15.4)
RPR Ser Ql: NONREACTIVE
WBC: 10 10*3/uL (ref 3.4–10.8)

## 2017-07-29 ENCOUNTER — Encounter: Payer: Self-pay | Admitting: Maternal Newborn

## 2017-07-31 ENCOUNTER — Encounter: Payer: Self-pay | Admitting: Obstetrics and Gynecology

## 2017-08-01 ENCOUNTER — Encounter: Payer: Self-pay | Admitting: Maternal Newborn

## 2017-08-08 ENCOUNTER — Encounter: Payer: Self-pay | Admitting: Obstetrics and Gynecology

## 2017-08-08 ENCOUNTER — Ambulatory Visit (INDEPENDENT_AMBULATORY_CARE_PROVIDER_SITE_OTHER): Payer: BLUE CROSS/BLUE SHIELD | Admitting: Obstetrics and Gynecology

## 2017-08-08 VITALS — BP 102/60 | Wt 190.0 lb

## 2017-08-08 DIAGNOSIS — M549 Dorsalgia, unspecified: Secondary | ICD-10-CM

## 2017-08-08 DIAGNOSIS — Z3A30 30 weeks gestation of pregnancy: Secondary | ICD-10-CM

## 2017-08-08 DIAGNOSIS — O2243 Hemorrhoids in pregnancy, third trimester: Secondary | ICD-10-CM

## 2017-08-08 DIAGNOSIS — O9989 Other specified diseases and conditions complicating pregnancy, childbirth and the puerperium: Secondary | ICD-10-CM

## 2017-08-08 DIAGNOSIS — R102 Pelvic and perineal pain: Secondary | ICD-10-CM

## 2017-08-08 DIAGNOSIS — Z3480 Encounter for supervision of other normal pregnancy, unspecified trimester: Secondary | ICD-10-CM

## 2017-08-08 NOTE — Progress Notes (Signed)
Routine Prenatal Care Visit  Subjective  Gabrielle Mcdowell is a 32 y.o. G2P1001 at 982w0d being seen today for ongoing prenatal care.  She is currently monitored for the following issues for this low-risk pregnancy and has Mitral valve prolapse; RLQ abdominal pain; Suprapubic pain; Galactorrhea; Cyst of right ovary; Mass of upper inner quadrant of left breast; and Supervision of other normal pregnancy, antepartum on their problem list.  ----------------------------------------------------------------------------------- Patient reports hemorrhoid pain, back and pelvic pain. .   Contractions: Irregular. Vag. Bleeding: None.  Movement: Present. Denies leaking of fluid.  ----------------------------------------------------------------------------------- The following portions of the patient's history were reviewed and updated as appropriate: allergies, current medications, past family history, past medical history, past social history, past surgical history and problem list. Problem list updated.   Objective  Blood pressure 102/60, weight 190 lb (86.2 kg), last menstrual period 01/10/2017. Pregravid weight 150 lb (68 kg) Total Weight Gain 40 lb (18.1 kg) Urinalysis: Urine Protein: Negative Urine Glucose: Negative  Fetal Status: Fetal Heart Rate (bpm): 135 Fundal Height: 30 cm Movement: Present     General:  Alert, oriented and cooperative. Patient is in no acute distress.  Skin: Skin is warm and dry. No rash noted.   Cardiovascular: Normal heart rate noted  Respiratory: Normal respiratory effort, no problems with respiration noted  Abdomen: Soft, gravid, appropriate for gestational age. Pain/Pressure: Present     Pelvic:  Cervical exam deferred        Extremities: Normal range of motion.     ental Status: Normal mood and affect. Normal behavior. Normal judgment and thought content.     Assessment   32 y.o. G2P1001 at 3782w0d by  10/17/2017, by Last Menstrual Period presenting for  routine prenatal visit  Plan   Pregnancy#2 Problems (from 01/10/17 to present)    Problem Noted Resolved   Supervision of other normal pregnancy, antepartum 02/24/2017 by Vena AustriaStaebler, Andreas, MD No   Overview Addendum 08/08/2017  5:04 PM by Natale MilchSchuman, Hedda Crumbley R, MD    Clinic [facility] Prenatal Labs  Dating LMP = 7 week [place] Blood type: A positive  Genetic Screen 1 Screen: negative   AFP: negative    Antibody:negative  Anatomic US Normal Female Rubella: Immune Varicella: Immune  GTT  normal RPR: NR  Rhogam N/A HBsAg: negative  TDaP vaccine DECLINED                       Flu Shot: declines HIV: negative  Baby Food  Breast                              GBS:   Contraception  Pap: NIL HPV negative 03/29/2016  CBB   Pelvis tested to 3540g 7lbs 13oz  CS/VBAC    Support Person                Gestational age appropriate obstetric precautions including but not limited to vaginal bleeding, contractions, leaking of fluid and fetal movement were reviewed in detail with the patient.    Discussed weight gain and goals to maintain current weight until delivery. She has gained 40 lbs so far this pregnancy.  Declined TDAP vaccination.  Recommended TUCKS pads for hemorrhoids.  Discussed travel recommendations. Recommended that the patient not take at trip planned for her 37th week of pregnancy were she would be more than an hour by ambulance away from a hospital in case delivery were to occur.  Return in about 2 weeks (around 08/22/2017) for ROB.  Adelene Idler MD [facility], [facility] 08/08/17 5:05 PM

## 2017-08-08 NOTE — Progress Notes (Signed)
ROB C/o Hemorid's, fatigue, some braxton hicks, pain in lower abdomin Denies lof,spotting/pressure Blood Consent signed today, Tdap declined

## 2017-08-22 ENCOUNTER — Ambulatory Visit (INDEPENDENT_AMBULATORY_CARE_PROVIDER_SITE_OTHER): Payer: BLUE CROSS/BLUE SHIELD | Admitting: Advanced Practice Midwife

## 2017-08-22 ENCOUNTER — Encounter: Payer: Self-pay | Admitting: Advanced Practice Midwife

## 2017-08-22 VITALS — BP 102/60 | Wt 193.5 lb

## 2017-08-22 DIAGNOSIS — Z3A32 32 weeks gestation of pregnancy: Secondary | ICD-10-CM

## 2017-08-22 LAB — POCT URINALYSIS DIPSTICK OB
GLUCOSE, UA: NEGATIVE — AB
PROTEIN: NEGATIVE

## 2017-08-22 NOTE — Progress Notes (Signed)
poc

## 2017-08-22 NOTE — Progress Notes (Signed)
Routine Prenatal Care Visit  Subjective  Gabrielle Mcdowell is a 32 y.o. G2P1001 at [redacted]w[redacted]d being seen today for ongoing prenatal care.  She is currently monitored for the following issues for this low-risk pregnancy and has Mitral valve prolapse; RLQ abdominal pain; Suprapubic pain; Galactorrhea; Cyst of right ovary; Mass of upper inner quadrant of left breast; and Supervision of other normal pregnancy, antepartum on their problem list.  ----------------------------------------------------------------------------------- Patient is wondering about her weight gain. She eats a vegan diet with what she describes as healthy sweets daily. She is encouraged to increase activity for healthy weight gain. Reviewed recommended healthy weight gain. She wonders about a pencil eraser size bruise on her right breast that went away after 5 days. Since she has a history of breast biopsy the bruise was concerning.  Reassurance given of normal. She has complaint of her right calf being bigger than her left calf. She denies s/s of DVT or SVT.  Contractions: Irregular. Vag. Bleeding: None.  Movement: Present. Denies leaking of fluid.  ----------------------------------------------------------------------------------- The following portions of the patient's history were reviewed and updated as appropriate: allergies, current medications, past family history, past medical history, past social history, past surgical history and problem list. Problem list updated.   Objective  Blood pressure 102/60, weight 193 lb 8 oz (87.8 kg), last menstrual period 01/10/2017. Pregravid weight 150 lb (68 kg) Total Weight Gain 43 lb 8 oz (19.7 kg) Urinalysis:      Fetal Status: Fetal Heart Rate (bpm): 140 Fundal Height: 32 cm Movement: Present     General:  Alert, oriented and cooperative. Patient is in no acute distress.  Skin: Skin is warm and dry. No rash noted.   Cardiovascular: Normal heart rate noted  Respiratory: Normal  respiratory effort, no problems with respiration noted  Abdomen: Soft, gravid, appropriate for gestational age. Pain/Pressure: Present     Pelvic:  Cervical exam deferred        Extremities: Normal range of motion.  Edema: None. No evidence of DVT  Mental Status: Normal mood and affect. Normal behavior. Normal judgment and thought content.   Assessment   32 y.o. G2P1001 at [redacted]w[redacted]d by  10/17/2017, by Last Menstrual Period presenting for routine prenatal visit  Plan   Pregnancy#2 Problems (from 01/10/17 to present)    Problem Noted Resolved   Supervision of other normal pregnancy, antepartum 02/24/2017 by Vena Austria, MD No   Overview Addendum 08/08/2017  5:04 PM by Natale Milch, MD    [facility]inic [facility] Prenatal Labs  Dating LMP = 7 week [place] Blood type: A positive  Genetic Screen 1 Screen: negative   AFP: negative    Antibody:negative  Anatomic [place] Normal Female Rubella: Immune Varicella: Immune  GTT  normal RPR: NR  Rhogam N/A HBsAg: negative  TDaP vaccine DECLINED                       Flu Shot: declines HIV: negative  Baby Food  Breast                              GBS:   Contraception  Pap: NIL HPV negative 03/29/2016  CBB   Pelvis tested to 3540g 7lbs 13oz  CS/VBAC    Support Person                Preterm labor symptoms and general obstetric precautions including but not limited to vaginal bleeding, contractions, leaking  of fluid and fetal movement were reviewed in detail with the patient.   Return in about 2 weeks (around 09/05/2017) for rob.  Tresea MallJane Nthony Lefferts, CNM 08/22/2017 4:52 PM

## 2017-09-01 ENCOUNTER — Observation Stay
Admission: EM | Admit: 2017-09-01 | Discharge: 2017-09-01 | Disposition: A | Discharge status: Home / Self Care | Payer: BLUE CROSS/BLUE SHIELD | Attending: Obstetrics and Gynecology | Admitting: Obstetrics and Gynecology

## 2017-09-01 ENCOUNTER — Other Ambulatory Visit: Payer: Self-pay

## 2017-09-01 DIAGNOSIS — O4703 False labor before 37 completed weeks of gestation, third trimester: Secondary | ICD-10-CM | POA: Diagnosis not present

## 2017-09-01 DIAGNOSIS — Z3490 Encounter for supervision of normal pregnancy, unspecified, unspecified trimester: Secondary | ICD-10-CM

## 2017-09-01 DIAGNOSIS — Z3480 Encounter for supervision of other normal pregnancy, unspecified trimester: Secondary | ICD-10-CM

## 2017-09-01 DIAGNOSIS — O368130 Decreased fetal movements, third trimester, not applicable or unspecified: Principal | ICD-10-CM | POA: Insufficient documentation

## 2017-09-01 DIAGNOSIS — R0602 Shortness of breath: Secondary | ICD-10-CM | POA: Insufficient documentation

## 2017-09-01 DIAGNOSIS — R42 Dizziness and giddiness: Secondary | ICD-10-CM

## 2017-09-01 DIAGNOSIS — R51 Headache: Secondary | ICD-10-CM | POA: Insufficient documentation

## 2017-09-01 DIAGNOSIS — O9989 Other specified diseases and conditions complicating pregnancy, childbirth and the puerperium: Secondary | ICD-10-CM

## 2017-09-01 DIAGNOSIS — Z3A34 34 weeks gestation of pregnancy: Secondary | ICD-10-CM | POA: Insufficient documentation

## 2017-09-01 LAB — CBC WITH DIFFERENTIAL/PLATELET
BASOS PCT: 0 %
Basophils Absolute: 0 10*3/uL (ref 0–0.1)
Eosinophils Absolute: 0.1 10*3/uL (ref 0–0.7)
Eosinophils Relative: 1 %
HEMATOCRIT: 32.2 % — AB (ref 35.0–47.0)
Hemoglobin: 11.4 g/dL — ABNORMAL LOW (ref 12.0–16.0)
Lymphocytes Relative: 16 %
Lymphs Abs: 1.6 10*3/uL (ref 1.0–3.6)
MCH: 31.3 pg (ref 26.0–34.0)
MCHC: 35.4 g/dL (ref 32.0–36.0)
MCV: 88.6 fL (ref 80.0–100.0)
Monocytes Absolute: 0.9 10*3/uL (ref 0.2–0.9)
Monocytes Relative: 9 %
NEUTROS ABS: 7.5 10*3/uL — AB (ref 1.4–6.5)
NEUTROS PCT: 74 %
Platelets: 232 10*3/uL (ref 150–440)
RBC: 3.63 MIL/uL — AB (ref 3.80–5.20)
RDW: 13.8 % (ref 11.5–14.5)
WBC: 10.1 10*3/uL (ref 3.6–11.0)

## 2017-09-01 LAB — COMPREHENSIVE METABOLIC PANEL
ALBUMIN: 3.1 g/dL — AB (ref 3.5–5.0)
ALT: 14 U/L (ref 0–44)
AST: 21 U/L (ref 15–41)
Alkaline Phosphatase: 68 U/L (ref 38–126)
Anion gap: 10 (ref 5–15)
BUN: 7 mg/dL (ref 6–20)
CHLORIDE: 106 mmol/L (ref 98–111)
CO2: 22 mmol/L (ref 22–32)
CREATININE: 0.43 mg/dL — AB (ref 0.44–1.00)
Calcium: 9.2 mg/dL (ref 8.9–10.3)
GFR calc Af Amer: >60 mL/min (ref >60)
GLUCOSE: 106 mg/dL — AB (ref 70–99)
Potassium: 3.6 mmol/L (ref 3.5–5.1)
Sodium: 138 mmol/L (ref 135–145)
TOTAL PROTEIN: 6.1 g/dL — AB (ref 6.5–8.1)
Total Bilirubin: 0.4 mg/dL (ref 0.3–1.2)

## 2017-09-01 LAB — PROTEIN / CREATININE RATIO, URINE
Creatinine, Urine: 16 mg/dL
Total Protein, Urine: <6 mg/dL

## 2017-09-01 NOTE — OB Triage Note (Signed)
Pt is G2P1 at 3470w3d that presents from the ED c/o seeing stars, Decreased FM, SOB, and ctx. All of this started around 0145. Pt is anxious and states her blood pressure at home was 136/85. Pt states she sat straight up and felt dizzy so when she laid back she had stabbing pain on top of her head and could not catch her breath. Monitors applied and assessing initial FHT 135. Pt has flushing in cheeks but denies current HA and denies VB, LOF.

## 2017-09-04 ENCOUNTER — Encounter: Payer: Self-pay | Admitting: Obstetrics and Gynecology

## 2017-09-04 ENCOUNTER — Ambulatory Visit (INDEPENDENT_AMBULATORY_CARE_PROVIDER_SITE_OTHER): Payer: BLUE CROSS/BLUE SHIELD | Admitting: Obstetrics and Gynecology

## 2017-09-04 VITALS — BP 100/60 | Wt 198.0 lb

## 2017-09-04 DIAGNOSIS — Z3483 Encounter for supervision of other normal pregnancy, third trimester: Secondary | ICD-10-CM

## 2017-09-04 DIAGNOSIS — Z3A33 33 weeks gestation of pregnancy: Secondary | ICD-10-CM

## 2017-09-04 DIAGNOSIS — Z3480 Encounter for supervision of other normal pregnancy, unspecified trimester: Secondary | ICD-10-CM

## 2017-09-04 LAB — POCT URINALYSIS DIPSTICK OB
Glucose, UA: NEGATIVE — AB
PROTEIN: NEGATIVE

## 2017-09-04 NOTE — Progress Notes (Signed)
Routine Prenatal Care Visit  Subjective  Gabrielle Mcdowell is a 32 y.o. G2P1001 at 4271w6d being seen today for ongoing prenatal care.  She is currently monitored for the following issues for this low-risk pregnancy and has Mitral valve prolapse; RLQ abdominal pain; Suprapubic pain; Galactorrhea; Cyst of right ovary; Mass of upper inner quadrant of left breast; Supervision of other normal pregnancy, antepartum; and Pregnancy on their problem list.  ----------------------------------------------------------------------------------- Patient reports that she had an episode of a racing heart, sharp pain on the top of her head that lasted about five minutes. This was followed by abdominal pain that lasted about 30 minutes. This happened four days ago and the she states the abdominal pain felt like a long contraction during which she could not feel her baby move. This all subsided and she no longer has any symptoms. She states that she went to L&D and was told that all was fine. She has no acute issues today.    Contractions: Not present. Vag. Bleeding: None.  Movement: Present. Denies leaking of fluid.  ----------------------------------------------------------------------------------- The following portions of the patient's history were reviewed and updated as appropriate: allergies, current medications, past family history, past medical history, past social history, past surgical history and problem list. Problem list updated.   Objective  Blood pressure 100/60, weight 198 lb (89.8 kg), last menstrual period 01/10/2017. Pregravid weight 150 lb (68 kg) Total Weight Gain 48 lb (21.8 kg) Urinalysis:      Fetal Status: Fetal Heart Rate (bpm): 145 Fundal Height: 34 cm Movement: Present     General:  Alert, oriented and cooperative. Patient is in no acute distress.  Skin: Skin is warm and dry. No rash noted.   Cardiovascular: Normal heart rate noted  Respiratory: Normal respiratory effort, no problems  with respiration noted  Abdomen: Soft, gravid, appropriate for gestational age. Pain/Pressure: Absent     Pelvic:  Cervical exam deferred        Extremities: Normal range of motion.  Edema: None  Mental Status: Normal mood and affect. Normal behavior. Normal judgment and thought content.   Assessment   32 y.o. G2P1001 at 6971w6d by  10/17/2017, by Last Menstrual Period presenting for routine prenatal visit  Plan   Pregnancy#2 Problems (from 01/10/17 to present)    Problem Noted Resolved   Supervision of other normal pregnancy, antepartum 02/24/2017 by Vena AustriaStaebler, Andreas, MD No   Overview Addendum 08/08/2017  5:04 PM by Natale MilchSchuman, Christanna R, MD    [facility]inic [facility] Prenatal Labs  Dating LMP = 7 week US Blood type: A positive  Genetic Screen 1 Screen: negative   AFP: negative    Antibody:negative  Anatomic US Normal Female Rubella: Immune Varicella: Immune  GTT  normal RPR: NR  Rhogam N/A HBsAg: negative  TDaP vaccine DECLINED                       Flu Shot: declines HIV: negative  Baby Food  Breast                              GBS:   Contraception  Pap: NIL HPV negative 03/29/2016  CBB   Pelvis tested to 3540g 7lbs 13oz  CS/VBAC    Support Person             Preterm labor symptoms and general obstetric precautions including but not limited to vaginal bleeding, contractions, leaking of fluid and fetal movement were  reviewed in detail with the patient. Please refer to After Visit Summary for other counseling recommendations.   - Patient reassured about her symptoms, given that they lasted only a short time and that she has no residual symptoms.  We discussed red-flag symptoms of headache with neurologic symptoms. We discussed abdominal pain (1 long contraction) in the setting vaginal bleeding.  We discussed a racing heart and shortness of breath that does not resolve with position changes. She voiced understanding and stated that she felt reassured. - Continue to monitor closely.   Return  in about 2 weeks (around 09/18/2017) for Routine Prenatal Appointment.  Thomasene Mohair, MD, [facility], [facility] 09/04/2017 5:29 PM

## 2017-09-08 NOTE — Discharge Summary (Signed)
OB Triage Discharge Summary   32 y.o. G2P1001 @ 6419w3d presenting for an episode at home around 0145 where she had a sudden headache and felt dizzy and short of breath with a fast heartbeat. She also described feeling a long contraction pain and decreased fetal movement during that time. No headache in triage. No vaginal bleeding or loss of fluid  Fetal status reassuring. EFM showed baseline of 135 bpm, accelerations present, decelerations absent, toco with uterine irritability. NST reactive.  Triage course: Blood pressures all normotensive. CBC, CMP, and PC ratio are normal. Patient is reassured with lab results and fetal well-being.  Disposition: Home  Triage evaluation done remotely: Yes.    Marcelyn BruinsJacelyn Schmid, CNM

## 2017-09-17 ENCOUNTER — Ambulatory Visit (INDEPENDENT_AMBULATORY_CARE_PROVIDER_SITE_OTHER): Payer: BLUE CROSS/BLUE SHIELD | Admitting: Obstetrics and Gynecology

## 2017-09-17 VITALS — BP 110/66 | Wt 200.0 lb

## 2017-09-17 DIAGNOSIS — Z3480 Encounter for supervision of other normal pregnancy, unspecified trimester: Secondary | ICD-10-CM

## 2017-09-17 DIAGNOSIS — Z3A35 35 weeks gestation of pregnancy: Secondary | ICD-10-CM

## 2017-09-17 DIAGNOSIS — O09899 Supervision of other high risk pregnancies, unspecified trimester: Secondary | ICD-10-CM | POA: Insufficient documentation

## 2017-09-17 DIAGNOSIS — O280 Abnormal hematological finding on antenatal screening of mother: Secondary | ICD-10-CM

## 2017-09-17 LAB — POCT URINALYSIS DIPSTICK OB
GLUCOSE, UA: NEGATIVE
POC,PROTEIN,UA: NEGATIVE

## 2017-09-17 NOTE — Progress Notes (Signed)
    Routine Prenatal Care Visit  Subjective  Gabrielle Mcdowell is a 32 y.o. G2P1001 at [redacted]w[redacted]d being seen today for ongoing prenatal care.  She is currently monitored for the following issues for this low-risk pregnancy and has Mitral valve prolapse; RLQ abdominal pain; Suprapubic pain; Galactorrhea; Cyst of right ovary; Mass of upper inner quadrant of left breast; Supervision of other normal pregnancy, antepartum; Pregnancy; and High risk pregnancy with low PAPPA on their problem list.  ----------------------------------------------------------------------------------- Patient reports no complaints.   Contractions: Not present. Vag. Bleeding: None.  Movement: Present. Denies leaking of fluid.  ----------------------------------------------------------------------------------- The following portions of the patient's history were reviewed and updated as appropriate: allergies, current medications, past family history, past medical history, past social history, past surgical history and problem list. Problem list updated.   Objective  Blood pressure 110/66, weight 200 lb (90.7 kg), last menstrual period 01/10/2017. Pregravid weight 150 lb (68 kg) Total Weight Gain 50 lb (22.7 kg) Urinalysis:      Fetal Status: Fetal Heart Rate (bpm): 140 Fundal Height: 34 cm Movement: Present     General:  Alert, oriented and cooperative. Patient is in no acute distress.  Skin: Skin is warm and dry. No rash noted.   Cardiovascular: Normal heart rate noted  Respiratory: Normal respiratory effort, no problems with respiration noted  Abdomen: Soft, gravid, appropriate for gestational age. Pain/Pressure: Absent     Pelvic:  Cervical exam deferred        Extremities: Normal range of motion.     ental Status: Normal mood and affect. Normal behavior. Normal judgment and thought content.     Assessment   32 y.o. G2P1001 at 104w5d by  10/17/2017, by Last Menstrual Period presenting for routine prenatal  visit  Plan   Pregnancy#2 Problems (from 01/10/17 to present)    Problem Noted Resolved   High risk pregnancy with low PAPPA 09/17/2017 by Vena Austria, MD No   Supervision of other normal pregnancy, antepartum 02/24/2017 by Vena Austria, MD No   Overview Addendum 08/08/2017  5:04 PM by Natale Milch, MD    [facility]inic [facility] Prenatal Labs  Dating LMP = 7 week [place] Blood type: A positive  Genetic Screen 1 Screen: negative   AFP: negative    Antibody:negative  Anatomic [place] Normal Female Rubella: Immune Varicella: Immune  GTT  normal RPR: NR  Rhogam N/A HBsAg: negative  TDaP vaccine DECLINED                       Flu Shot: declines HIV: negative  Baby Food  Breast                              GBS:   Contraception  Pap: NIL HPV negative 03/29/2016  CBB   Pelvis tested to 3540g 7lbs 13oz  CS/VBAC    Support Person                Gestational age appropriate obstetric precautions including but not limited to vaginal bleeding, contractions, leaking of fluid and fetal movement were reviewed in detail with the patient.    - growth scan for low PAPP-A, still above 0.40 MoM but will obtain 36 week growth scan - GBS next visit  Return in about 1 week (around 09/24/2017) for ROB and growth scan.  Vena Austria, MD, [facility], [facility] 09/17/2017, 3:16 PM

## 2017-09-17 NOTE — Progress Notes (Signed)
ROB Interested in having a DOULA

## 2017-09-22 ENCOUNTER — Ambulatory Visit (INDEPENDENT_AMBULATORY_CARE_PROVIDER_SITE_OTHER): Payer: BLUE CROSS/BLUE SHIELD

## 2017-09-22 ENCOUNTER — Ambulatory Visit (INDEPENDENT_AMBULATORY_CARE_PROVIDER_SITE_OTHER): Payer: BLUE CROSS/BLUE SHIELD | Admitting: Obstetrics & Gynecology

## 2017-09-22 VITALS — BP 110/60 | Wt 200.0 lb

## 2017-09-22 DIAGNOSIS — O09899 Supervision of other high risk pregnancies, unspecified trimester: Secondary | ICD-10-CM

## 2017-09-22 DIAGNOSIS — Z3480 Encounter for supervision of other normal pregnancy, unspecified trimester: Secondary | ICD-10-CM

## 2017-09-22 DIAGNOSIS — Z3483 Encounter for supervision of other normal pregnancy, third trimester: Secondary | ICD-10-CM

## 2017-09-22 DIAGNOSIS — O280 Abnormal hematological finding on antenatal screening of mother: Secondary | ICD-10-CM | POA: Diagnosis not present

## 2017-09-22 DIAGNOSIS — Z3A36 36 weeks gestation of pregnancy: Secondary | ICD-10-CM

## 2017-09-22 LAB — POCT URINALYSIS DIPSTICK OB
Glucose, UA: NEGATIVE
PROTEIN: NEGATIVE

## 2017-09-22 NOTE — Patient Instructions (Signed)
Braxton Hicks Contractions °Contractions of the uterus can occur throughout pregnancy, but they are not always a sign that you are in labor. You may have practice contractions called Braxton Hicks contractions. These false labor contractions are sometimes confused with true labor. °What are Braxton Hicks contractions? °Braxton Hicks contractions are tightening movements that occur in the muscles of the uterus before labor. Unlike true labor contractions, these contractions do not result in opening (dilation) and thinning of the cervix. Toward the end of pregnancy (32-34 weeks), Braxton Hicks contractions can happen more often and may become stronger. These contractions are sometimes difficult to tell apart from true labor because they can be very uncomfortable. You should not feel embarrassed if you go to the hospital with false labor. °Sometimes, the only way to tell if you are in true labor is for your health care provider to look for changes in the cervix. The health care provider will do a physical exam and may monitor your contractions. If you are not in true labor, the exam should show that your cervix is not dilating and your water has not broken. °If there are other health problems associated with your pregnancy, it is completely safe for you to be sent home with false labor. You may continue to have Braxton Hicks contractions until you go into true labor. °How to tell the difference between true labor and false labor °True labor °· Contractions last 30-70 seconds. °· Contractions become very regular. °· Discomfort is usually felt in the top of the uterus, and it spreads to the lower abdomen and low back. °· Contractions do not go away with walking. °· Contractions usually become more intense and increase in frequency. °· The cervix dilates and gets thinner. °False labor °· Contractions are usually shorter and not as strong as true labor contractions. °· Contractions are usually irregular. °· Contractions  are often felt in the front of the lower abdomen and in the groin. °· Contractions may go away when you walk around or change positions while lying down. °· Contractions get weaker and are shorter-lasting as time goes on. °· The cervix usually does not dilate or become thin. °Follow these instructions at home: °· Take over-the-counter and prescription medicines only as told by your health care provider. °· Keep up with your usual exercises and follow other instructions from your health care provider. °· Eat and drink lightly if you think you are going into labor. °· If Braxton Hicks contractions are making you uncomfortable: °? Change your position from lying down or resting to walking, or change from walking to resting. °? Sit and rest in a tub of warm water. °? Drink enough fluid to keep your urine pale yellow. Dehydration may cause these contractions. °? Do slow and deep breathing several times an hour. °· Keep all follow-up prenatal visits as told by your health care provider. This is important. °Contact a health care provider if: °· You have a fever. °· You have continuous pain in your abdomen. °Get help right away if: °· Your contractions become stronger, more regular, and closer together. °· You have fluid leaking or gushing from your vagina. °· You pass blood-tinged mucus (bloody show). °· You have bleeding from your vagina. °· You have low back pain that you never had before. °· You feel your baby’s head pushing down and causing pelvic pressure. °· Your baby is not moving inside you as much as it used to. °Summary °· Contractions that occur before labor are called Braxton   Hicks contractions, false labor, or practice contractions. °· Braxton Hicks contractions are usually shorter, weaker, farther apart, and less regular than true labor contractions. True labor contractions usually become progressively stronger and regular and they become more frequent. °· Manage discomfort from Braxton Hicks contractions by  changing position, resting in a warm bath, drinking plenty of water, or practicing deep breathing. °This information is not intended to replace advice given to you by your health care provider. Make sure you discuss any questions you have with your health care provider. °Document Released: 05/16/2016 Document Revised: 05/16/2016 Document Reviewed: 05/16/2016 °Elsevier Interactive Patient Education © 2018 [employer] ° °

## 2017-09-22 NOTE — Progress Notes (Signed)
  Subjective  Fetal Movement? yes Contractions? no Leaking Fluid? no Vaginal Bleeding? no  Objective  BP 110/60   Wt 200 lb (90.7 kg)   LMP 01/10/2017 (Exact Date)   BMI 32.28 kg/m  General: NAD Pumonary: no increased work of breathing Abdomen: gravid, non-tender Extremities: no edema Psychiatric: mood appropriate, affect full  Assessment  32 y.o. G2P1001 at [redacted]w[redacted]d by  10/17/2017, by Last Menstrual Period presenting for routine prenatal visit  Plan   Problem List Items Addressed This Visit      Other   Supervision of other normal pregnancy, antepartum - Primary   Relevant Orders   Culture, beta strep (group b only)   Pregnancy   Relevant Orders   Culture, beta strep (group b only)   POC Urinalysis Dipstick OB (Completed)    GBS  Annamarie Major, MD, [facility], [facility] 09/22/2017  2:44 PM

## 2017-09-26 ENCOUNTER — Other Ambulatory Visit: Payer: BLUE CROSS/BLUE SHIELD

## 2017-09-26 ENCOUNTER — Emergency Department
Admission: EM | Admit: 2017-09-26 | Discharge: 2017-09-26 | Disposition: A | Discharge status: Home / Self Care | Payer: BLUE CROSS/BLUE SHIELD | Attending: Emergency Medicine | Admitting: Emergency Medicine

## 2017-09-26 ENCOUNTER — Other Ambulatory Visit: Payer: Self-pay

## 2017-09-26 ENCOUNTER — Encounter: Payer: BLUE CROSS/BLUE SHIELD | Admitting: Obstetrics and Gynecology

## 2017-09-26 DIAGNOSIS — Z3A37 37 weeks gestation of pregnancy: Secondary | ICD-10-CM | POA: Diagnosis not present

## 2017-09-26 DIAGNOSIS — Z79899 Other long term (current) drug therapy: Secondary | ICD-10-CM | POA: Insufficient documentation

## 2017-09-26 DIAGNOSIS — R002 Palpitations: Secondary | ICD-10-CM

## 2017-09-26 DIAGNOSIS — O99419 Diseases of the circulatory system complicating pregnancy, unspecified trimester: Secondary | ICD-10-CM | POA: Diagnosis not present

## 2017-09-26 LAB — CBC WITH DIFFERENTIAL/PLATELET
Basophils Absolute: 0 10*3/uL (ref 0–0.1)
Basophils Relative: 0 %
EOS ABS: 0.2 10*3/uL (ref 0–0.7)
EOS PCT: 2 %
HCT: 33.7 % — ABNORMAL LOW (ref 35.0–47.0)
HEMOGLOBIN: 11.8 g/dL — AB (ref 12.0–16.0)
Lymphocytes Relative: 19 %
Lymphs Abs: 1.8 10*3/uL (ref 1.0–3.6)
MCH: 31.6 pg (ref 26.0–34.0)
MCHC: 35 g/dL (ref 32.0–36.0)
MCV: 90.2 fL (ref 80.0–100.0)
MONOS PCT: 9 %
Monocytes Absolute: 0.9 10*3/uL (ref 0.2–0.9)
NEUTROS PCT: 70 %
Neutro Abs: 6.9 10*3/uL — ABNORMAL HIGH (ref 1.4–6.5)
PLATELETS: 229 10*3/uL (ref 150–440)
RBC: 3.73 MIL/uL — ABNORMAL LOW (ref 3.80–5.20)
RDW: 14.8 % — AB (ref 11.5–14.5)
WBC: 9.9 10*3/uL (ref 3.6–11.0)

## 2017-09-26 LAB — URINALYSIS, COMPLETE (UACMP) WITH MICROSCOPIC
BILIRUBIN URINE: NEGATIVE
Glucose, UA: NEGATIVE mg/dL
HGB URINE DIPSTICK: NEGATIVE
KETONES UR: NEGATIVE mg/dL
LEUKOCYTES UA: NEGATIVE
NITRITE: NEGATIVE
Protein, ur: NEGATIVE mg/dL
Specific Gravity, Urine: 1.001 — ABNORMAL LOW (ref 1.005–1.030)
WBC UA: NONE SEEN WBC/hpf (ref 0–5)
pH: 7 (ref 5.0–8.0)

## 2017-09-26 LAB — COMPREHENSIVE METABOLIC PANEL
ALBUMIN: 3.1 g/dL — AB (ref 3.5–5.0)
ALT: 14 U/L (ref 0–44)
ANION GAP: 9 (ref 5–15)
AST: 23 U/L (ref 15–41)
Alkaline Phosphatase: 80 U/L (ref 38–126)
BUN: 7 mg/dL (ref 6–20)
CHLORIDE: 109 mmol/L (ref 98–111)
CO2: 19 mmol/L — AB (ref 22–32)
Calcium: 8.9 mg/dL (ref 8.9–10.3)
Creatinine, Ser: 0.35 mg/dL — ABNORMAL LOW (ref 0.44–1.00)
GFR calc non Af Amer: >60 mL/min (ref >60)
GLUCOSE: 106 mg/dL — AB (ref 70–99)
POTASSIUM: 3.5 mmol/L (ref 3.5–5.1)
Sodium: 137 mmol/L (ref 135–145)
Total Bilirubin: 0.4 mg/dL (ref 0.3–1.2)
Total Protein: 6.1 g/dL — ABNORMAL LOW (ref 6.5–8.1)

## 2017-09-26 LAB — TSH: TSH: 1.129 u[IU]/mL (ref 0.350–4.500)

## 2017-09-26 LAB — T4, FREE: FREE T4: 0.65 ng/dL — AB (ref 0.82–1.77)

## 2017-09-26 LAB — TROPONIN I: Troponin I: <0.03 ng/mL (ref <0.03)

## 2017-09-26 MED ORDER — SODIUM CHLORIDE 0.9 % IV BOLUS
1000.0000 mL | Freq: Once | INTRAVENOUS | Status: AC
Start: 2017-09-26 — End: 2017-09-26
  Administered 2017-09-26: 1000 mL via INTRAVENOUS

## 2017-09-26 NOTE — ED Notes (Addendum)
Fetal Heart tones per [place] 130 bps.

## 2017-09-26 NOTE — ED Notes (Addendum)
Pt educated on discharge paperwork and follow-up.

## 2017-09-26 NOTE — ED Notes (Signed)
Fetal heart tones assessed, present on assessment.

## 2017-09-26 NOTE — ED Provider Notes (Signed)
[facility] Emergency Department Provider Note   ____________________________________________   First MD Initiated Contact with Patient 09/26/17 [phone]     (approximate)  I have reviewed the triage vital signs and the nursing notes.   HISTORY  Chief Complaint Palpitations    HPI Gabrielle Mcdowell is a 32 y.o. female who presents to the ED from home with a chief complaint of palpitations.  Patient has a history of palpitations since she was a teenager.  Sees a cardiologist for this.  At one time she was on a beta-blocker which she discontinued because she did not like the side effects.  Had a Holter monitor in January which she reports was negative.  Tonight she felt like her heart was skipping beats in addition to racing.  Denies associated fever, chills, chest pain, shortness of breath, abdominal pain, nausea, vomiting, dysuria, diarrhea, vaginal bleeding, lightheadedness or dizziness.  Does states she has been outside in the heat recently as well as taking care of her 36-year-old.  She is [redacted] weeks pregnant and wanted to be checked out.  Denies caffeine use as it caused tachycardia previously.  Denies recent travel or trauma.   Past Medical History:  Diagnosis Date  . Alpha-1-antitrypsin deficiency (HCC)   . Anxiety   . GERD (gastroesophageal reflux disease)   . Hemorrhoids   . Mitral valve prolapse     Patient Active Problem List   Diagnosis Date Noted  . High risk pregnancy with low PAPPA 09/17/2017  . Pregnancy 09/01/2017  . Supervision of other normal pregnancy, antepartum 02/24/2017  . Galactorrhea 12/30/2016  . Cyst of right ovary 12/30/2016  . Mass of upper inner quadrant of left breast 12/30/2016  . RLQ abdominal pain 09/19/2016  . Suprapubic pain 09/19/2016  . Mitral valve prolapse     Past Surgical History:  Procedure Laterality Date  . ESOPHAGOGASTRODUODENOSCOPY ENDOSCOPY      Prior to Admission medications   Medication Sig Start  Date End Date Taking? Authorizing Provider  B Complex Vitamins (VITAMIN B COMPLEX PO) Take by mouth.    [provider]  Cholecalciferol (VITAMIN D3) 1000 units CAPS Take 1 capsule by mouth daily.    [provider]  Ferrous Sulfate (IRON) 325 (65 Fe) MG TABS  07/16/17   [provider]  omega-3 acid ethyl esters (LOVAZA) 1 g capsule Take by mouth.    [provider]  Prenatal Ca Carb-B6-B12-FA (BP FOLINATAL PLUS B) 1 MG TABS Take by mouth.    [provider]    Allergies Brompheniramine-pseudoeph; Clarithromycin; Robitussin (alcohol free) [guaifenesin]; and Sulfa antibiotics  Family History  Problem Relation Age of Onset  . Hypertension Father   . Prostate cancer Paternal Uncle   . Breast cancer Other   . Breast cancer Maternal Aunt 52       stage 0    Social History Social History   Tobacco Use  . Smoking status: Never Smoker  . Smokeless tobacco: Never Used  Substance Use Topics  . Alcohol use: No  . Drug use: No    Review of Systems  Constitutional: No fever/chills Eyes: No visual changes. ENT: No sore throat. Cardiovascular: Positive for palpitations.  Denies chest pain. Respiratory: Denies shortness of breath. Gastrointestinal: No abdominal pain.  No nausea, no vomiting.  No diarrhea.  No constipation. Genitourinary: Negative for dysuria. Musculoskeletal: Negative for back pain. Skin: Negative for rash. Neurological: Negative for headaches, focal weakness or numbness.   ____________________________________________   PHYSICAL  EXAM:  VITAL SIGNS: ED Triage Vitals  Enc Vitals Group     BP 09/26/17 0034 (!) 133/92     Pulse Rate 09/26/17 0034 (!) 110     Resp 09/26/17 0034 18     Temp 09/26/17 0034 98.3 F (36.8 C)     Temp Source 09/26/17 0034 Oral     SpO2 09/26/17 0034 100 %     Weight 09/26/17 0035 200 lb (90.7 kg)     Height 09/26/17 0035 5\' 6"  (1.676 m)     Head Circumference --      Peak Flow --       Pain Score 09/26/17 0035 0     Pain Loc --      Pain Edu? --      Excl. in GC? --     Constitutional: Alert and oriented. Well appearing and in no acute distress. Eyes: Conjunctivae are normal. PERRL. EOMI. Head: Atraumatic. Nose: No congestion/rhinnorhea. Mouth/Throat: Mucous membranes are mildly dry.  Oropharynx non-erythematous. Neck: No stridor.   Cardiovascular: Tachycardic rate, regular rhythm. Grossly normal heart sounds.  Good peripheral circulation. Respiratory: Normal respiratory effort.  No retractions. Lungs CTAB. Gastrointestinal: Gravid.  Soft and nontender. No distention. No abdominal bruits. No CVA tenderness. Musculoskeletal: No lower extremity tenderness nor edema.  No joint effusions. Neurologic:  Normal speech and language. No gross focal neurologic deficits are appreciated. No gait instability. Skin:  Skin is warm, dry and intact. No rash noted. Psychiatric: Mood and affect are normal. Speech and behavior are normal.  ____________________________________________   LABS (all labs ordered are listed, but only abnormal results are displayed)  Labs Reviewed  CBC WITH DIFFERENTIAL/PLATELET - Abnormal; Notable for the following components:      Result Value   RBC 3.73 (*)    Hemoglobin 11.8 (*)    HCT 33.7 (*)    RDW 14.8 (*)    Neutro Abs 6.9 (*)    All other components within normal limits  COMPREHENSIVE METABOLIC PANEL - Abnormal; Notable for the following components:   CO2 19 (*)    Glucose, Bld 106 (*)    Creatinine, Ser 0.35 (*)    Total Protein 6.1 (*)    Albumin 3.1 (*)    All other components within normal limits  URINALYSIS, COMPLETE (UACMP) WITH MICROSCOPIC - Abnormal; Notable for the following components:   Color, Urine COLORLESS (*)    APPearance CLEAR (*)    Specific Gravity, Urine 1.001 (*)    Bacteria, UA RARE (*)    All other components within normal limits  T4, FREE - Abnormal; Notable for the following components:   Free T4 0.65 (*)      All other components within normal limits  TROPONIN I  TSH   ____________________________________________  EKG  ED ECG REPORT I, Elasha Tess J, the attending physician, personally viewed and interpreted this ECG.   Date: 09/26/2017  EKG Time: 0041  Rate: 114  Rhythm: sinus tachycardia  Axis: Normal  Intervals:none  ST&T Change: Nonspecific  ____________________________________________  RADIOLOGY  ED MD interpretation: Deferred  Official radiology report(s): No results found.  ____________________________________________   PROCEDURES  Procedure(s) performed: None  Procedures  Critical Care performed: No  ____________________________________________   INITIAL IMPRESSION / ASSESSMENT AND PLAN / ED COURSE  As part of my medical decision making, I reviewed the following data within the electronic MEDICAL RECORD NUMBER History obtained from family, Nursing notes reviewed and incorporated, Labs reviewed, EKG interpreted, Old chart reviewed and Notes  from prior ED visits   32 year old female approximately [redacted] weeks pregnant who presents with palpitations.  Differential diagnosis includes but is not limited to CAD, PE, dehydration, UTI, electrolyte abnormalities, metabolic etiology, etc.  Will initiate IV fluids for tachycardia, check screening lab work including thyroid panel, urinalysis and reassess.  Clinical Course as of Sep 27 319  Fri Sep 26, 2017  0318 Heart rate improved.  Patient feeling better after IV fluids.  Updated her of laboratory results.  She will call Dr. Harl Bowie in the morning for an appointment.  Strict return precautions given.  Patient verbalizes understanding agrees with plan of care.   [JS]    Clinical Course User Index [JS] Irean Hong, MD     ____________________________________________   FINAL CLINICAL IMPRESSION(S) / ED DIAGNOSES  Final diagnoses:  Heart palpitations     ED Discharge Orders    None       Note:  This  document was prepared using Dragon voice recognition software and may include unintentional dictation errors.    Irean Hong, MD 09/26/17 [phone]

## 2017-09-26 NOTE — Discharge Instructions (Addendum)
Drink plenty of fluids daily.  Return to the ER for worsening symptoms, persistent vomiting, difficulty breathing or other concerns. °

## 2017-09-26 NOTE — ED Triage Notes (Addendum)
Pt states she has had palpitations for about 45 min, hx of the same. States symptoms tonight have been persistent. Pt is [redacted] weeks pregnant with no pregnancy complaints.

## 2017-09-26 NOTE — ED Notes (Signed)
Pt states that she was sitting getting ready for bed when she started having palpitations and was worried due to the fact that she is [redacted] weeks pregnant. Pt AxOx4 with no palpitations at this time.

## 2017-09-27 LAB — CULTURE, BETA STREP (GROUP B ONLY): STREP GP B CULTURE: NEGATIVE

## 2017-10-01 ENCOUNTER — Ambulatory Visit (INDEPENDENT_AMBULATORY_CARE_PROVIDER_SITE_OTHER): Payer: BLUE CROSS/BLUE SHIELD | Admitting: Obstetrics and Gynecology

## 2017-10-01 VITALS — BP 114/62 | Wt 202.0 lb

## 2017-10-01 DIAGNOSIS — Z3A37 37 weeks gestation of pregnancy: Secondary | ICD-10-CM

## 2017-10-01 DIAGNOSIS — O09899 Supervision of other high risk pregnancies, unspecified trimester: Secondary | ICD-10-CM

## 2017-10-01 DIAGNOSIS — O280 Abnormal hematological finding on antenatal screening of mother: Secondary | ICD-10-CM

## 2017-10-01 DIAGNOSIS — Z3480 Encounter for supervision of other normal pregnancy, unspecified trimester: Secondary | ICD-10-CM

## 2017-10-01 LAB — POCT URINALYSIS DIPSTICK OB
GLUCOSE, UA: NEGATIVE
POC,PROTEIN,UA: NEGATIVE

## 2017-10-01 NOTE — Progress Notes (Signed)
ROB Cardiologist appointment/holter monitor

## 2017-10-02 NOTE — Progress Notes (Signed)
.   Routine Prenatal Care Visit  Subjective  Gabrielle Mcdowell is a 32 y.o. G2P1001 at 3175w6d being seen today for ongoing prenatal care.  She is currently monitored for the following issues for this low-risk pregnancy and has Mitral valve prolapse; RLQ abdominal pain; Suprapubic pain; Galactorrhea; Cyst of right ovary; Mass of upper inner quadrant of left breast; Supervision of other normal pregnancy, antepartum; Pregnancy; and High risk pregnancy with low PAPPA on their problem list.  ----------------------------------------------------------------------------------- Patient reports no complaints.   Contractions: Not present. Vag. Bleeding: None.  Movement: Present. Denies leaking of fluid.  ----------------------------------------------------------------------------------- The following portions of the patient's history were reviewed and updated as appropriate: allergies, current medications, past family history, past medical history, past social history, past surgical history and problem list. Problem list updated.   Objective  Blood pressure 114/62, weight 202 lb (91.6 kg), last menstrual period 01/10/2017. Pregravid weight 150 lb (68 kg) Total Weight Gain 52 lb (23.6 kg) Urinalysis:      Fetal Status: Fetal Heart Rate (bpm): 145 Fundal Height: 36 cm Movement: Present     General:  Alert, oriented and cooperative. Patient is in no acute distress.  Skin: Skin is warm and dry. No rash noted.   Cardiovascular: Normal heart rate noted  Respiratory: Normal respiratory effort, no problems with respiration noted  Abdomen: Soft, gravid, appropriate for gestational age. Pain/Pressure: Absent     Pelvic:  Cervical exam deferred        Extremities: Normal range of motion.     ental Status: Normal mood and affect. Normal behavior. Normal judgment and thought content.     Assessment   32 y.o. G2P1001 at 4275w6d by  10/17/2017, by Last Menstrual Period presenting for routine prenatal  visit  Plan   Pregnancy#2 Problems (from 01/10/17 to present)    Problem Noted Resolved   High risk pregnancy with low PAPPA 09/17/2017 by Vena AustriaStaebler, Candon Caras, MD No   Supervision of other normal pregnancy, antepartum 02/24/2017 by Vena AustriaStaebler, Javaya [place], MD No   Overview Addendum 08/08/2017  5:04 PM by Natale MilchSchuman, Christanna R, MD    [facility]inic [facility] Prenatal Labs  Dating LMP = 7 week [place] Blood type: A positive  Genetic Screen 1 Screen: negative   AFP: negative    Antibody:negative  Anatomic [place] Normal Female Rubella: Immune Varicella: Immune  GTT  normal RPR: NR  Rhogam N/A HBsAg: negative  TDaP vaccine DECLINED                       Flu Shot: declines HIV: negative  Baby Food  Breast                              GBS:   Contraception  Pap: NIL HPV negative 03/29/2016  CBB   Pelvis tested to 3540g 7lbs 13oz  CS/VBAC    Support Person                Gestational age appropriate obstetric precautions including but not limited to vaginal bleeding, contractions, leaking of fluid and fetal movement were reviewed in detail with the patient.   - Follow up this week with cardiology on Holter resutls - EKG only showing sinus tachycardia per patient - Growth scan last week appropriate growth obtained because of borderline low PAPP-A  Return in about 2 weeks (around 10/15/2017) for ROB.  Vena AustriaAndreas Brittyn Salaz, MD, [facility] FrederickFACOG [facility], [facility]

## 2017-10-10 ENCOUNTER — Ambulatory Visit (INDEPENDENT_AMBULATORY_CARE_PROVIDER_SITE_OTHER): Payer: BLUE CROSS/BLUE SHIELD | Admitting: Obstetrics and Gynecology

## 2017-10-10 VITALS — BP 112/60 | Wt 202.0 lb

## 2017-10-10 DIAGNOSIS — O280 Abnormal hematological finding on antenatal screening of mother: Secondary | ICD-10-CM

## 2017-10-10 DIAGNOSIS — Z3A39 39 weeks gestation of pregnancy: Secondary | ICD-10-CM

## 2017-10-10 DIAGNOSIS — Z3480 Encounter for supervision of other normal pregnancy, unspecified trimester: Secondary | ICD-10-CM

## 2017-10-10 DIAGNOSIS — O09899 Supervision of other high risk pregnancies, unspecified trimester: Secondary | ICD-10-CM

## 2017-10-10 NOTE — Progress Notes (Signed)
ROB

## 2017-10-12 ENCOUNTER — Other Ambulatory Visit: Payer: Self-pay

## 2017-10-12 ENCOUNTER — Encounter: Payer: Self-pay | Admitting: *Deleted

## 2017-10-12 ENCOUNTER — Observation Stay
Admission: EM | Admit: 2017-10-12 | Discharge: 2017-10-12 | Disposition: A | Discharge status: Home / Self Care | Payer: BLUE CROSS/BLUE SHIELD | Attending: Obstetrics and Gynecology | Admitting: Obstetrics and Gynecology

## 2017-10-12 DIAGNOSIS — O09899 Supervision of other high risk pregnancies, unspecified trimester: Secondary | ICD-10-CM

## 2017-10-12 DIAGNOSIS — O471 False labor at or after 37 completed weeks of gestation: Secondary | ICD-10-CM

## 2017-10-12 DIAGNOSIS — Z3A39 39 weeks gestation of pregnancy: Secondary | ICD-10-CM | POA: Insufficient documentation

## 2017-10-12 DIAGNOSIS — O280 Abnormal hematological finding on antenatal screening of mother: Secondary | ICD-10-CM

## 2017-10-12 DIAGNOSIS — Z3480 Encounter for supervision of other normal pregnancy, unspecified trimester: Secondary | ICD-10-CM

## 2017-10-12 NOTE — Progress Notes (Signed)
    Routine Prenatal Care Visit  Subjective  Gabrielle Mcdowell is a 32 y.o. G2P1001 at [redacted]w[redacted]d being seen today for ongoing prenatal care.  She is currently monitored for the following issues for this low-risk pregnancy and has Mitral valve prolapse; RLQ abdominal pain; Suprapubic pain; Galactorrhea; Cyst of right ovary; Mass of upper inner quadrant of left breast; Supervision of other normal pregnancy, antepartum; Pregnancy; High risk pregnancy with low PAPPA; and Indication for care in labor and delivery, antepartum on their problem list.  ----------------------------------------------------------------------------------- Patient reports no complaints.   Contractions: Not present. Vag. Bleeding: None.  Movement: Present. Denies leaking of fluid.  ----------------------------------------------------------------------------------- The following portions of the patient's history were reviewed and updated as appropriate: allergies, current medications, past family history, past medical history, past social history, past surgical history and problem list. Problem list updated.   Objective  Blood pressure 112/60, weight 202 lb (91.6 kg), last menstrual period 01/10/2017. Pregravid weight 150 lb (68 kg) Total Weight Gain 52 lb (23.6 kg) Urinalysis:      Fetal Status: Fetal Heart Rate (bpm): 150 Fundal Height: 38 cm Movement: Present  Presentation: Vertex  General:  Alert, oriented and cooperative. Patient is in no acute distress.  Skin: Skin is warm and dry. No rash noted.   Cardiovascular: Normal heart rate noted  Respiratory: Normal respiratory effort, no problems with respiration noted  Abdomen: Soft, gravid, appropriate for gestational age. Pain/Pressure: Absent     Pelvic:  Cervical exam performed Dilation: 1 Effacement (%): 50 Station: -3  Extremities: Normal range of motion.     ental Status: Normal mood and affect. Normal behavior. Normal judgment and thought content.     Assessment    32 y.o. G2P1001 at [redacted]w[redacted]d by  10/17/2017, by Last Menstrual Period presenting for routine prenatal visit  Plan   Pregnancy#2 Problems (from 01/10/17 to present)    Problem Noted Resolved   High risk pregnancy with low PAPPA 09/17/2017 by Vena Austria, MD No   Supervision of other normal pregnancy, antepartum 02/24/2017 by Vena Austria, MD No   Overview Addendum 08/08/2017  5:04 PM by Natale Milch, MD    Clinic [facility] Prenatal Labs  Dating LMP = 7 week [place] Blood type: A positive  Genetic Screen 1 Screen: negative   AFP: negative    Antibody:negative  Anatomic [place] Normal Female Rubella: Immune Varicella: Immune  GTT  normal RPR: NR  Rhogam N/A HBsAg: negative  TDaP vaccine DECLINED                       Flu Shot: declines HIV: negative  Baby Food  Breast                              GBS:   Contraception  Pap: NIL HPV negative 03/29/2016  CBB   Pelvis tested to 3540g 7lbs 13oz  CS/VBAC    Support Person                Gestational age appropriate obstetric precautions including but not limited to vaginal bleeding, contractions, leaking of fluid and fetal movement were reviewed in detail with the patient.    Return in about 1 week (around 10/17/2017) for ROB.  Vena Austria, MD, [facility], [facility]

## 2017-10-12 NOTE — Discharge Summary (Signed)
See Final Progress Note 10/12/2017.

## 2017-10-12 NOTE — Final Progress Note (Signed)
Physician Final Progress Note  Patient ID: Gabrielle Mcdowell MRN: [id number] DOB/AGE: Aug 07, 1985 32 y.o.  Admit date: 10/12/2017 Admitting provider: Vena Austria, MD Discharge date: 10/12/2017   Admission Diagnoses:  Indication for care in labor and delivery, antepartum  Discharge Diagnoses:  None  History of Present Illness: The patient is a 32 y.o. female G2P1001 at [redacted]w[redacted]d who presents for some sharp pain in her upper abdomen that happened early this morning. The pain radiated to her back. Nothing made it better or worse. Did not take any medications. She was able to go back to sleep for three hours and awoke with a similar pain. It has now resolved spontaneously. Denies headache, vaginal bleeding, contractions, and loss of fluid. Baby is moving well.  Review of Systems: Review of systems negative unless otherwise noted in HPI.   Past Medical History:  Diagnosis Date  . Alpha-1-antitrypsin deficiency (HCC)   . Anxiety   . GERD (gastroesophageal reflux disease)   . Hemorrhoids   . Mitral valve prolapse     Past Surgical History:  Procedure Laterality Date  . ESOPHAGOGASTRODUODENOSCOPY ENDOSCOPY      No current facility-administered medications on file prior to encounter.    Current Outpatient Medications on File Prior to Encounter  Medication Sig Dispense Refill  . B Complex Vitamins (VITAMIN B COMPLEX PO) Take by mouth.    . Cholecalciferol (VITAMIN D3) 1000 units CAPS Take 1 capsule by mouth daily.    . Ferrous Sulfate (IRON) 325 (65 Fe) MG TABS     . omega-3 acid ethyl esters (LOVAZA) 1 g capsule Take by mouth.    . Prenatal Ca Carb-B6-B12-FA (BP FOLINATAL PLUS B) 1 MG TABS Take by mouth.      Allergies  Allergen Reactions  . Brompheniramine-Pseudoeph Hives  . Clarithromycin Hives  . Robitussin (Alcohol Free) [Guaifenesin] Hives  . Sulfa Antibiotics Hives    Social History   Socioeconomic History  . Marital status: Married    Spouse name: Not on file   . Number of children: Not on file  . Years of education: Not on file  . Highest education level: Not on file  Occupational History  . Not on file  Social Needs  . Financial resource strain: Not hard at all  . Food insecurity:    Worry: Never true    Inability: Never true  . Transportation needs:    Medical: No    Non-medical: No  Tobacco Use  . Smoking status: Never Smoker  . Smokeless tobacco: Never Used  Substance and Sexual Activity  . Alcohol use: No  . Drug use: No  . Sexual activity: Yes    Birth control/protection: None    Comment: undecided  Lifestyle  . Physical activity:    Days per week: 3 days    Minutes per session: 30 min  . Stress: To some extent  Relationships  . Social connections:    Talks on phone: Twice a week    Gets together: Once a week    Attends religious service: 1 to 4 times per year    Active member of club or organization: No    Attends meetings of clubs or organizations: Never    Relationship status: Married  . Intimate partner violence:    Fear of current or ex partner: No    Emotionally abused: No    Physically abused: No    Forced sexual activity: No  Other Topics Concern  . Not on file  Social History  Narrative  . Not on file    Family history: Negative/unremarkable except as detailed in HPI. No family history of birth defects.   Physical Exam: BP 114/73 (BP Location: Left Arm)   Pulse (!) 106   Temp 98 F (36.7 C) (Oral)   Resp 16   Ht 5\' 6"  (1.676 m)   Wt 91.6 kg   LMP 01/10/2017 (Exact Date)   BMI 32.60 kg/m   Gen: NAD CV: Tachycardia Pulm: No increased work of breathing Pelvic: deferred Ext: No signs of DVT.  NST Baseline: 145 Variability: moderate Accelerations: present Decelerations: absent Tocometry: occasional contraction on monitor The patient was monitored for 30+ minutes, fetal heart rate tracing was deemed reactive.  Consults: None  Procedures: None  Discharge Condition: good  Disposition:    Diet: Regular diet  Discharge Activity: Activity as tolerated  Discharge Instructions    Discharge activity:  No Restrictions   Complete by:  As directed    Discharge diet:  No restrictions   Complete by:  As directed    Fetal Kick Count:  Lie on our left side for one hour after a meal, and count the number of times your baby kicks.  If it is less than 5 times, get up, move around and drink some juice.  Repeat the test 30 minutes later.  If it is still less than 5 kicks in an hour, notify your doctor.   Complete by:  As directed    LABOR:  When conractions begin, you should start to time them from the beginning of one contraction to the beginning  of the next.  When contractions are 5 - 10 minutes apart or less and have been regular for at least an hour, you should call your health care provider.   Complete by:  As directed    No sexual activity restrictions   Complete by:  As directed    Notify physician for bleeding from the vagina   Complete by:  As directed    Notify physician for blurring of vision or spots before the eyes   Complete by:  As directed    Notify physician for chills or fever   Complete by:  As directed    Notify physician for fainting spells, "black outs" or loss of consciousness   Complete by:  As directed    Notify physician for increase in vaginal discharge   Complete by:  As directed    Notify physician for leaking of fluid   Complete by:  As directed    Notify physician for pain or burning when urinating   Complete by:  As directed    Notify physician for pelvic pressure (sudden increase)   Complete by:  As directed    Notify physician for severe or continued nausea or vomiting   Complete by:  As directed    Notify physician for sudden gushing of fluid from the vagina (with or without continued leaking)   Complete by:  As directed    Notify physician for sudden, constant, or occasional abdominal pain   Complete by:  As directed    Notify physician if  baby moving less than usual   Complete by:  As directed      Allergies as of 10/12/2017      Reactions   Brompheniramine-pseudoeph Hives   Clarithromycin Hives   Robitussin (alcohol Free) [guaifenesin] Hives   Sulfa Antibiotics Hives      Medication List    TAKE these medications  BP FOLINATAL PLUS B 1 MG Tabs Take by mouth.   Iron 325 (65 Fe) MG Tabs   omega-3 acid ethyl esters 1 g capsule Commonly known as:  LOVAZA Take by mouth.   VITAMIN B COMPLEX PO Take by mouth.   Vitamin D3 1000 units Caps Take 1 capsule by mouth daily.      Discussed that her abdominal pains could be caused by gas or indigestion and that she can use OTC antacids such as Tums and Zantac to help. NST reactive and no signs of labor. Keep scheduled ROB appointment on Friday.  Signed: Oswaldo Conroy, CNM  10/12/2017, 6:24 PM

## 2017-10-12 NOTE — OB Triage Note (Signed)
Woke this am with upper abdominal pain radiating across abdomen and to back. Was able to go back to sleep x 3 hrs then woke with pain again. Denies taking medication for pain. Currently pain has gone away. Elaina Hoops

## 2017-10-17 ENCOUNTER — Ambulatory Visit (INDEPENDENT_AMBULATORY_CARE_PROVIDER_SITE_OTHER): Payer: BLUE CROSS/BLUE SHIELD | Admitting: Obstetrics & Gynecology

## 2017-10-17 VITALS — BP 120/80 | Wt 202.0 lb

## 2017-10-17 DIAGNOSIS — Z3480 Encounter for supervision of other normal pregnancy, unspecified trimester: Secondary | ICD-10-CM

## 2017-10-17 DIAGNOSIS — O480 Post-term pregnancy: Secondary | ICD-10-CM

## 2017-10-17 DIAGNOSIS — Z3A40 40 weeks gestation of pregnancy: Secondary | ICD-10-CM

## 2017-10-17 NOTE — Patient Instructions (Signed)
Labor Induction Labor induction is when steps are taken to cause a pregnant woman to begin the labor process. Most women go into labor on their own between 37 weeks and 42 weeks of the pregnancy. When this does not happen or when there is a medical need, methods may be used to induce labor. Labor induction causes a pregnant woman's uterus to contract. It also causes the cervix to soften (ripen), open (dilate), and thin out (efface). Usually, labor is not induced before 39 weeks of the pregnancy unless there is a problem with the baby or mother. Before inducing labor, your health care provider will consider a number of factors, including the following:  The medical condition of you and the baby.  How many weeks along you are.  The status of the baby's lung maturity.  The condition of the cervix.  The position of the baby. What are the reasons for labor induction? Labor may be induced for the following reasons:  The health of the baby or mother is at risk.  The pregnancy is overdue by 1 week or more.  The water breaks but labor does not start on its own.  The mother has a health condition or serious illness, such as high blood pressure, infection, placental abruption, or diabetes.  The amniotic fluid amounts are low around the baby.  The baby is distressed. Convenience or wanting the baby to be born on a certain date is not a reason for inducing labor. What methods are used for labor induction? Several methods of labor induction may be used, such as:  Prostaglandin medicine. This medicine causes the cervix to dilate and ripen. The medicine will also start contractions. It can be taken by mouth or by inserting a suppository into the vagina.  Inserting a thin tube (catheter) with a balloon on the end into the vagina to dilate the cervix. Once inserted, the balloon is expanded with water, which causes the cervix to open.  Stripping the membranes. Your health care provider separates  amniotic sac tissue from the cervix, causing the cervix to be stretched and causing the release of a hormone called progesterone. This may cause the uterus to contract. It is often done during an office visit. You will be sent home to wait for the contractions to begin. You will then come in for an induction.  Breaking the water. Your health care provider makes a hole in the amniotic sac using a small instrument. Once the amniotic sac breaks, contractions should begin. This may still take hours to see an effect.  Medicine to trigger or strengthen contractions. This medicine is given through an IV access tube inserted into a vein in your arm. All of the methods of induction, besides stripping the membranes, will be done in the hospital. Induction is done in the hospital so that you and the baby can be carefully monitored. How long does it take for labor to be induced? Some inductions can take up to 2-3 days. Depending on the cervix, it usually takes less time. It takes longer when you are induced early in the pregnancy or if this is your first pregnancy. If a mother is still pregnant and the induction has been going on for 2-3 days, either the mother will be sent home or a cesarean delivery will be needed. What are the risks associated with labor induction? Some of the risks of induction include:  Changes in fetal heart rate, such as too high, too low, or erratic.  Fetal distress.    Chance of infection for the mother and baby.  Increased chance of having a cesarean delivery.  Breaking off (abruption) of the placenta from the uterus (rare).  Uterine rupture (very rare). When induction is needed for medical reasons, the benefits of induction may outweigh the risks. What are some reasons for not inducing labor? Labor induction should not be done if:  It is shown that your baby does not tolerate labor.  You have had previous surgeries on your uterus, such as a myomectomy or the removal of  fibroids.  Your placenta lies very low in the uterus and blocks the opening of the cervix (placenta previa).  Your baby is not in a head-down position.  The umbilical cord drops down into the birth canal in front of the baby. This could cut off the baby's blood and oxygen supply.  You have had a previous cesarean delivery.  There are unusual circumstances, such as the baby being extremely premature. This information is not intended to replace advice given to you by your health care provider. Make sure you discuss any questions you have with your health care provider. Document Released: 05/22/2006 Document Revised: 06/08/2015 Document Reviewed: 07/30/2012 Elsevier Interactive Patient Education  2017 Elsevier Inc.  

## 2017-10-17 NOTE — Progress Notes (Signed)
  [facility]IONAL BIRTHPLACE INDUCTION ASSESSMENT SCHEDULING Gabrielle Mcdowell [birth year] Medical record #: [id number] Phone #:  Home Phone [phone]  Mobile [phone]   Prenatal Provider:[facility] Delivering Group:[facility] Proposed admission date/time:10/23/17, 0500 Method of induction:Cytotec  Weight: Filed Weights10/04/19 1506Weight:202 lb (91.6 kg) BMI Body mass index is 32.6 kg/m. HIV Negative HSV Negative EDC Estimated Date of Delivery: 10/4/19based on:LMP  Gestational age on admission: 40 6/7 Gravidity/parity:G2P1001  Cervix Score   0 1 2 3   Position Posterior Midposition Anterior   Consistency Firm Medium Soft   Effacement (%) 0-30 40-50 60-70 >80  Dilation (cm) Closed 1-2 3-4 >5  Baby's station -3 -2 -1 +1, +2   Bishop Score:4   Medical induction of labor  select indication(s) below Elective induction ?39 weeks multiparous patient ?39 weeks primiparous patient with Bishop score ?7 ?40 weeks primiparous patient   Medical Indications Adapted from ACOG Committee Opinion #560, "Medically Indicated Late Preterm and Early Term Deliveries," 2013.  PLACENTAL / UTERINE ISSUES FETAL ISSUES MATERNAL ISSUES  ? Placenta previa (36.0-37.6) ? Isoimmunization (37.0-38.6) ? Preeclampsia without severe features or gestational HTN (37.0)  ? Suspected accreta (34.0-35.6) ? Growth Restriction Mason Jim) ? Preeclampsia with severe features (34.0)  ? Prior classical CD, uterine window, rupture (36.0-37.6) ? Isolated (38.0-39.6) ? Chronic HTN (38.0-39.6)  ? Prior myomectomy (37.0-38.6) ? Concurrent findings (34.0-37.6) ? Cholestasis (37.0)  ? Umbilical vein varix (37.0) ? Growth Restriction (Twins) ? Diabetes  ? Placental abruption (chronic) ? Di-Di Isolated (36.0-37.6) ? Pregestational, controlled (39.0)  OBSTETRIC ISSUES ? Di-Di concurrent findings (32.0-34.6) ? Pregestational, uncontrolled (37.0-39.0)  ? Postdates ? (41 weeks) ? Mo-Di isolated (32.0-34.6)  ? Pregestational, vascular compromise (37.0- 39.0)  ? PPROM (34.0) ? Multiple Gestation ? Gestational, diet controlled (40.0)  ? Hx of IUFD (39.0 weeks) ? Di-Di (38.0-38.6) ? Gestational, med controlled (39.0)  ? Polyhydramnios, mild/moderate; SDV 8-16 or AFI 25-35 (39.0) ? Mo-Di (36.0-37.6) ? Gestational, uncontrolled (38.0-39.0)  ? Oligohydramnios (36.0-37.6); MVP <2 cm  Provider Signature: Letitia Libra Scheduled MV:[id number] Date:10/17/2017 3:31 PM   Call [phone] to finalize the induction date/time  [id number] (07/17)

## 2017-10-17 NOTE — Progress Notes (Signed)
  Subjective  Fetal Movement? yes Contractions? yes Leaking Fluid? no Vaginal Bleeding? no  Objective  BP 120/80   Wt 202 lb (91.6 kg)   LMP 01/10/2017 (Exact Date)   BMI 32.60 kg/m  General: NAD Pumonary: no increased work of breathing Abdomen: gravid, non-tender Extremities: no edema Psychiatric: mood appropriate, affect full  Assessment  32 y.o. G2P1001 at [redacted]w[redacted]d by  10/17/2017, by Last Menstrual Period presenting for routine prenatal visit  Plan   Problem List Items Addressed This Visit      Other   Supervision of other normal pregnancy, antepartum   Pregnancy - Primary      Annamarie Major, MD, [facility], [facility] 10/17/2017  3:31 PM

## 2017-10-19 ENCOUNTER — Inpatient Hospital Stay
Admission: EM | Admit: 2017-10-19 | Discharge: 2017-10-21 | DRG: 806 | Disposition: A | Discharge status: Home / Self Care | Payer: BLUE CROSS/BLUE SHIELD | Attending: Obstetrics & Gynecology | Admitting: Obstetrics & Gynecology

## 2017-10-19 ENCOUNTER — Other Ambulatory Visit: Payer: Self-pay

## 2017-10-19 DIAGNOSIS — D62 Acute posthemorrhagic anemia: Secondary | ICD-10-CM | POA: Diagnosis not present

## 2017-10-19 DIAGNOSIS — O9081 Anemia of the puerperium: Secondary | ICD-10-CM | POA: Diagnosis not present

## 2017-10-19 DIAGNOSIS — Z3A40 40 weeks gestation of pregnancy: Secondary | ICD-10-CM

## 2017-10-19 DIAGNOSIS — O480 Post-term pregnancy: Secondary | ICD-10-CM | POA: Diagnosis present

## 2017-10-19 DIAGNOSIS — Z3480 Encounter for supervision of other normal pregnancy, unspecified trimester: Secondary | ICD-10-CM

## 2017-10-19 DIAGNOSIS — I341 Nonrheumatic mitral (valve) prolapse: Secondary | ICD-10-CM | POA: Diagnosis present

## 2017-10-19 DIAGNOSIS — O9942 Diseases of the circulatory system complicating childbirth: Principal | ICD-10-CM | POA: Diagnosis present

## 2017-10-19 DIAGNOSIS — Z3483 Encounter for supervision of other normal pregnancy, third trimester: Secondary | ICD-10-CM | POA: Diagnosis present

## 2017-10-19 DIAGNOSIS — O926 Galactorrhea: Secondary | ICD-10-CM

## 2017-10-19 LAB — CBC
HEMATOCRIT: 37 % (ref 35.0–47.0)
HEMOGLOBIN: 12.7 g/dL (ref 12.0–16.0)
MCH: 30.9 pg (ref 26.0–34.0)
MCHC: 34.3 g/dL (ref 32.0–36.0)
MCV: 90.2 fL (ref 80.0–100.0)
Platelets: 188 10*3/uL (ref 150–440)
RBC: 4.1 MIL/uL (ref 3.80–5.20)
RDW: 15.2 % — ABNORMAL HIGH (ref 11.5–14.5)
WBC: 11.9 10*3/uL — AB (ref 3.6–11.0)

## 2017-10-19 LAB — TYPE AND SCREEN
ABO/RH(D): A POS
ANTIBODY SCREEN: NEGATIVE

## 2017-10-19 MED ORDER — DIPHENHYDRAMINE HCL 25 MG PO CAPS: 25.0000 mg | ORAL_CAPSULE | Freq: Four times a day (QID) | ORAL | Status: DC | PRN

## 2017-10-19 MED ORDER — BUTORPHANOL TARTRATE 1 MG/ML IJ SOLN: 1.0000 mg | INTRAMUSCULAR | Status: DC | PRN

## 2017-10-19 MED ORDER — LACTATED RINGERS IV SOLN: 500.0000 mL | INTRAVENOUS | Status: DC | PRN

## 2017-10-19 MED ORDER — LIDOCAINE HCL (PF) 1 % IJ SOLN
INTRAMUSCULAR | Status: AC
  Filled 2017-10-19: qty 30

## 2017-10-19 MED ORDER — HYDROCODONE-ACETAMINOPHEN 5-325 MG PO TABS: 1.0000 | ORAL_TABLET | Freq: Four times a day (QID) | ORAL | Status: DC | PRN

## 2017-10-19 MED ORDER — WITCH HAZEL-GLYCERIN EX PADS
1.0000 "application " | MEDICATED_PAD | CUTANEOUS | Status: DC | PRN
  Filled 2017-10-19: qty 100

## 2017-10-19 MED ORDER — BENZOCAINE-MENTHOL 20-0.5 % EX AERO
1.0000 "application " | INHALATION_SPRAY | CUTANEOUS | Status: DC | PRN
  Filled 2017-10-19: qty 56

## 2017-10-19 MED ORDER — ACETAMINOPHEN 325 MG PO TABS: 650.0000 mg | ORAL_TABLET | ORAL | Status: DC | PRN

## 2017-10-19 MED ORDER — FERROUS SULFATE 325 (65 FE) MG PO TABS
325.0000 mg | ORAL_TABLET | Freq: Two times a day (BID) | ORAL | Status: DC
  Administered 2017-10-19 – 2017-10-21 (×5): 325 mg via ORAL
  Filled 2017-10-19 (×5): qty 1

## 2017-10-19 MED ORDER — SENNOSIDES-DOCUSATE SODIUM 8.6-50 MG PO TABS
2.0000 | ORAL_TABLET | ORAL | Status: DC
  Administered 2017-10-20 – 2017-10-21 (×2): 2 via ORAL
  Filled 2017-10-19 (×3): qty 2

## 2017-10-19 MED ORDER — DIBUCAINE 1 % RE OINT
1.0000 "application " | TOPICAL_OINTMENT | RECTAL | Status: DC | PRN
  Filled 2017-10-19: qty 28

## 2017-10-19 MED ORDER — ONDANSETRON HCL 4 MG/2ML IJ SOLN: 4.0000 mg | INTRAMUSCULAR | Status: DC | PRN

## 2017-10-19 MED ORDER — ONDANSETRON HCL 4 MG PO TABS: 4.0000 mg | ORAL_TABLET | ORAL | Status: DC | PRN

## 2017-10-19 MED ORDER — AMMONIA AROMATIC IN INHA
RESPIRATORY_TRACT | Status: AC
  Filled 2017-10-19: qty 10

## 2017-10-19 MED ORDER — IBUPROFEN 600 MG PO TABS
600.0000 mg | ORAL_TABLET | Freq: Four times a day (QID) | ORAL | Status: DC
  Administered 2017-10-19: 600 mg via ORAL
  Filled 2017-10-19: qty 1

## 2017-10-19 MED ORDER — ONDANSETRON HCL 4 MG/2ML IJ SOLN: 4.0000 mg | Freq: Four times a day (QID) | INTRAMUSCULAR | Status: DC | PRN

## 2017-10-19 MED ORDER — OXYTOCIN BOLUS FROM INFUSION: 500.0000 mL | Freq: Once | INTRAVENOUS | Status: DC

## 2017-10-19 MED ORDER — IBUPROFEN 600 MG PO TABS
600.0000 mg | ORAL_TABLET | Freq: Four times a day (QID) | ORAL | Status: DC
  Administered 2017-10-19 – 2017-10-20 (×4): 600 mg via ORAL
  Filled 2017-10-19 (×4): qty 1

## 2017-10-19 MED ORDER — PRENATAL MULTIVITAMIN CH
1.0000 | ORAL_TABLET | Freq: Every day | ORAL | Status: DC
  Administered 2017-10-19 – 2017-10-21 (×3): 1 via ORAL
  Filled 2017-10-19 (×3): qty 1

## 2017-10-19 MED ORDER — SIMETHICONE 80 MG PO CHEW: 80.0000 mg | CHEWABLE_TABLET | ORAL | Status: DC | PRN

## 2017-10-19 MED ORDER — OXYTOCIN 40 UNITS IN LACTATED RINGERS INFUSION - SIMPLE MED
2.5000 [IU]/h | INTRAVENOUS | Status: DC
  Filled 2017-10-19: qty 1000

## 2017-10-19 MED ORDER — LIDOCAINE HCL (PF) 1 % IJ SOLN: 30.0000 mL | INTRAMUSCULAR | Status: DC | PRN

## 2017-10-19 MED ORDER — TERBUTALINE SULFATE 1 MG/ML IJ SOLN: 0.2500 mg | Freq: Once | INTRAMUSCULAR | Status: DC | PRN

## 2017-10-19 MED ORDER — OXYTOCIN 10 UNIT/ML IJ SOLN
INTRAMUSCULAR | Status: AC
  Filled 2017-10-19: qty 2

## 2017-10-19 MED ORDER — LACTATED RINGERS IV SOLN: INTRAVENOUS | Status: DC

## 2017-10-19 MED ORDER — OXYTOCIN 40 UNITS IN LACTATED RINGERS INFUSION - SIMPLE MED: 2.5000 [IU]/h | INTRAVENOUS | Status: DC

## 2017-10-19 MED ORDER — COCONUT OIL OIL
1.0000 "application " | TOPICAL_OIL | Status: DC | PRN
  Filled 2017-10-19 (×2): qty 120

## 2017-10-19 MED ORDER — MISOPROSTOL 200 MCG PO TABS
ORAL_TABLET | ORAL | Status: AC
  Filled 2017-10-19: qty 4

## 2017-10-19 MED ORDER — MISOPROSTOL 25 MCG QUARTER TABLET: 25.0000 ug | ORAL_TABLET | ORAL | Status: DC | PRN

## 2017-10-19 MED ORDER — LACTATED RINGERS IV SOLN
INTRAVENOUS | Status: DC
  Administered 2017-10-19: 05:00:00 via INTRAVENOUS

## 2017-10-19 NOTE — Lactation Note (Signed)
This note was copied from a baby's chart. Lactation Consultation Note  Patient Name: Gabrielle Mcdowell Today's Date: 10/19/2017 Reason for consult: Initial assessment;Term;Mother's request  Mom anxious with lots of questions.  Father of baby was very reassuring.  Demonstrated how to hand express lots of colostrum to entice him to latch. Assisted mom with latching Gladstone Pih to right breast in cross cradle hold with 2 boppy pillows.  Initially Gladstone Pih was sucking in lower lip possibly due to over bite causing shallow latch.  Demonstrated how to gently put pressure on chin to flip out lower lip for deeper latch.  At first, he was gagging with scant amount of emesis.  Mom reports first baby had lots of reflux which she attributed to overactive let down.  Mom breast fed first baby for 16 months, but started introducing some formula at 11 months.  Mom kept pulling back on breast to make breathing room.  Discouraged mom from pulling back on breast showing how it caused a shallow latch.  Gladstone Pih is still a little sleepy from the circumcision requiring frequent stimulation to keep him actively sucking.  Reviewed supply and demand, normal course of lactation and routine newborn feeding patterns.  Lactation name and number written on white board and encouraged to call with any questions, concerns or assistance.   Maternal Data Formula Feeding for Exclusion: No Has patient been taught Hand Expression?: Yes(Can easily hand express colostrum) Does the patient have breastfeeding experience prior to this delivery?: Yes  Feeding Feeding Type: Breast Fed  LATCH Score Latch: Repeated attempts needed to sustain latch, nipple held in mouth throughout feeding, stimulation needed to elicit sucking reflex.  Audible Swallowing: A few with stimulation  Type of Nipple: Everted at rest and after stimulation  Comfort (Breast/Nipple): Soft / non-tender  Hold (Positioning): Assistance needed to correctly position infant at breast  and maintain latch.  LATCH Score: 7  Interventions Interventions: Breast feeding basics reviewed;Assisted with latch;Breast compression;Skin to skin;Adjust position;Breast massage;Support pillows;Hand express;Position options  Lactation Tools Discussed/Used WIC Program: No(BCBS)   Consult Status Consult Status: PRN    Louis Meckel 10/19/2017, 9:49 PM

## 2017-10-19 NOTE — OB Triage Note (Signed)
Pt c/o ctx x24hs, loss of mucus plug, no bleeding

## 2017-10-19 NOTE — Discharge Summary (Signed)
OB Discharge Summary     Patient Name: Gabrielle Mcdowell DOB: 08-16-1985 MRN: [id number]  Date of admission: 10/19/2017 Delivering MD: Thomasene Mohair, MD  Date of Delivery: 10/19/2017  Date of discharge: 10/21/2017 Admitting diagnosis: Normal Labor Intrauterine pregnancy: [redacted]w[redacted]d     Secondary diagnosis: None     Discharge diagnosis: Term Pregnancy Delivered                                                                                                Post partum procedures:none  Augmentation: AROM  Complications: None  Hospital course:  Onset of Labor With Vaginal Delivery     32 y.o. yo G2P1001 at [redacted]w[redacted]d was admitted in Active Labor on 10/19/2017. Patient had an uncomplicated labor course as follows:  Membrane Rupture Time/Date: 8:25 AM ,10/19/2017   Intrapartum Procedures: Episiotomy: None [1]                                         Lacerations:  1st degree [2];Vaginal [6];Perineal [11]  Patient had a delivery of a Viable infant. 10/19/2017  Information for the patient's newborn:  Elyce, Zollinger [[id number]]  Delivery Method: Vag-Spont    Pateint had an uncomplicated postpartum course.  She is ambulating, tolerating a regular diet, passing flatus, and urinating well. Patient is discharged home in stable condition on 10/21/2017   Physical exam  Vitals:   10/20/17 1613 10/20/17 1942 10/20/17 2341 10/21/17 0800  BP: 106/74 105/81 102/76 103/73  Pulse: 92 100 100 86  Resp: 18 18 18 20   Temp: 98.1 F (36.7 C) 98.2 F (36.8 C) 98 F (36.7 C) 97.8 F (36.6 C)  TempSrc: Oral Oral Oral Oral  SpO2: 97% 96% 98% 99%  Weight:      Height:       General: alert, cooperative and no distress Lochia: appropriate Uterine Fundus: firm/ U-3/ML/NT Incision: N/A DVT Evaluation: No evidence of DVT seen on physical exam.  Labs: Lab Results  Component Value Date   WBC 10.2 10/20/2017   HGB 11.1 (L) 10/20/2017   HCT 32.5 (L) 10/20/2017   MCV 91.2 10/20/2017   PLT 178 10/20/2017     Discharge instruction: per After Visit Summary.  Medications:  Allergies as of 10/21/2017      Reactions   Brompheniramine-pseudoeph Hives   Clarithromycin Hives   Robitussin (alcohol Free) [guaifenesin] Hives   Sulfa Antibiotics Hives      Medication List    TAKE these medications   BP FOLINATAL PLUS B 1 MG Tabs Take by mouth.   Iron 325 (65 Fe) MG Tabs   omega-3 acid ethyl esters 1 g capsule Commonly known as:  LOVAZA Take by mouth.   VITAMIN B COMPLEX PO Take by mouth.   Vitamin D3 1000 units Caps Take 1 capsule by mouth daily.       Diet: routine diet  Activity: Advance as tolerated. Pelvic rest for 6 weeks.   Outpatient follow up: Follow-up Information    Conard Novak, MD.  Schedule an appointment as soon as possible for a visit in 2 week(s).   Specialty:  Obstetrics and Gynecology Why:  2-week postpartum follow up Contact information: [address] [place] [address] [phone]             Postpartum contraception: planning vasectomy?/ condoms Rhogam Given postpartum: no Rubella vaccine given postpartum: no Varicella vaccine given postpartum: no TDaP given antepartum or postpartum: patient declined. Last TDAP 2016 Influenza Vaccine given antepartum or postpartum: Patient declined AP  Newborn Data: Live born female Jomarie Longs "Gladstone Pih" Birth Weight:  8#4.3oz APGAR: 8, 8  Newborn Delivery   Birth date/time:  10/19/2017 10:14:00 Delivery type:  Vaginal, Spontaneous     Baby Feeding: Breast  Disposition:home with mother  SIGNED: Farrel Conners, CNM

## 2017-10-19 NOTE — H&P (Signed)
OB History & Physical   History of Present Illness:  Chief Complaint: contractions  HPI:  Gabrielle Mcdowell is a 32 y.o. G2P1001 female at [redacted]w[redacted]d dated by LMP.  Her pregnancy has been complicated by mitral valve prolapse, RLQ abdominal pain, suprapubic pain, galactorrhea, cyst of right ovary, mass of upper inner quadrant of left breast, low PAPPA.    She reports contractions since 2 AM Saturday morning.   She denies leakage of fluid.   She denies vaginal bleeding.   She reports fetal movement.    Maternal Medical History:   Past Medical History:  Diagnosis Date  . Alpha-1-antitrypsin deficiency (HCC)   . Anxiety   . GERD (gastroesophageal reflux disease)   . Hemorrhoids   . Mitral valve prolapse     Past Surgical History:  Procedure Laterality Date  . ESOPHAGOGASTRODUODENOSCOPY ENDOSCOPY      Allergies  Allergen Reactions  . Brompheniramine-Pseudoeph Hives  . Clarithromycin Hives  . Robitussin (Alcohol Free) [Guaifenesin] Hives  . Sulfa Antibiotics Hives    Prior to Admission medications   Medication Sig Start Date End Date Taking? Authorizing Provider  Prenatal Ca Carb-B6-B12-FA (BP FOLINATAL PLUS B) 1 MG TABS Take by mouth.   Yes [provider]  B Complex Vitamins (VITAMIN B COMPLEX PO) Take by mouth.    [provider]  Cholecalciferol (VITAMIN D3) 1000 units CAPS Take 1 capsule by mouth daily.    [provider]  Ferrous Sulfate (IRON) 325 (65 Fe) MG TABS  07/16/17   [provider]  omega-3 acid ethyl esters (LOVAZA) 1 g capsule Take by mouth.    [provider]    OB History  Gravida Para Term Preterm AB Living  2 1 1     1   SAB TAB Ectopic Multiple Live Births               # Outcome Date GA Lbr Len/2nd Weight Sex Delivery Anes PTL Lv  2 Current           1 Term 02/10/14 [redacted]w[redacted]d  3234 g F Vag-Spont       Prenatal care site: [facility]  Social History: She  reports that she has never smoked. She has never  used smokeless tobacco. She reports that she does not drink alcohol or use drugs.  Family History: family history includes Breast cancer in her other; Breast cancer (age of onset: 27) in her maternal aunt; Hypertension in her father; Prostate cancer in her paternal uncle.    Review of Systems: Negative x 10 systems reviewed except as noted in the HPI.    Physical Exam:  Vital Signs: BP 120/69 (BP Location: Left Arm)   Pulse 89   Temp 98.3 F (36.8 C) (Oral)   Resp 19   Ht 5\' 6"  (1.676 m)   Wt 91.6 kg   LMP 01/10/2017 (Exact Date)   BMI 32.60 kg/m  Constitutional: Well nourished, well developed female in no acute distress.  HEENT: normal Skin: Warm and dry.  Cardiovascular: Regular rate and rhythm.   Extremity: reflexes 2+  Respiratory: Clear to auscultation bilateral. Normal respiratory effort Abdomen: FHT present Back: no CVAT Neuro: DTRs 2+, Cranial nerves grossly intact Psych: Alert and Oriented x3. No memory deficits. Normal mood and affect.  MS: normal gait, normal bilateral lower extremity ROM/strength/stability.  Pelvic exam:   Cervix: 2 cm on arrival to L&D at 2 AM this morning and change to 4/80/-3 by RN   Pertinent Results:  Prenatal Labs: Blood type/Rh A positive  Antibody screen negative  Rubella Immune  Varicella Immune    RPR Non-reactive  HBsAg negative  HIV negative  GC negative  Chlamydia negative  Genetic screening negative  1 hour GTT 117  3 hour GTT NA  GBS negative on 9/10   Baseline FHR: 145 beats/min   Variability: moderate   Accelerations: present   Decelerations: absent Contractions: present frequency: every 1-3, she is sitting on the ball and difficult to trace Overall assessment: Reassuring    Assessment:  Gabrielle Mcdowell is a 32 y.o. G46P1001 female at [redacted]w[redacted]d with active labor contractions.   Plan:  1. Admit to Labor & Delivery  2. CBC, T&S, Clrs, IVF 3. GBS negative.   4. Fetal well-being: Category I 5. Expectant  management for vaginal delivery   Tresea Mall, [facility] 10/19/2017 6:42 AM

## 2017-10-20 ENCOUNTER — Encounter: Payer: BLUE CROSS/BLUE SHIELD | Admitting: Obstetrics & Gynecology

## 2017-10-20 LAB — CBC
HEMATOCRIT: 32.5 % — AB (ref 35.0–47.0)
Hemoglobin: 11.1 g/dL — ABNORMAL LOW (ref 12.0–16.0)
MCH: 31.3 pg (ref 26.0–34.0)
MCHC: 34.3 g/dL (ref 32.0–36.0)
MCV: 91.2 fL (ref 80.0–100.0)
Platelets: 178 10*3/uL (ref 150–440)
RBC: 3.56 MIL/uL — AB (ref 3.80–5.20)
RDW: 15.1 % — AB (ref 11.5–14.5)
WBC: 10.2 10*3/uL (ref 3.6–11.0)

## 2017-10-20 MED ORDER — IBUPROFEN 600 MG PO TABS
600.0000 mg | ORAL_TABLET | Freq: Four times a day (QID) | ORAL | Status: DC
  Administered 2017-10-20 – 2017-10-21 (×4): 600 mg via ORAL
  Filled 2017-10-20 (×4): qty 1

## 2017-10-20 MED ORDER — WHITE PETROLATUM EX OINT
TOPICAL_OINTMENT | CUTANEOUS | Status: AC
  Filled 2017-10-20: qty 28.35

## 2017-10-20 NOTE — Lactation Note (Signed)
This note was copied from a baby's chart. Lactation Consultation Note  Patient Name: Gabrielle Mcdowell [id number] Date: 10/20/2017 Reason for consult: Follow-up assessment;Term Mom was told to call for LC to observe next feeding.  Gladstone Pih woke mom up.  Observed mom latching him to left breast in cradle hold.  He is doing better with not curling lips in when goes on breast.  Mom still experience slight nipple discomfort on initial latch that eases up shortly into breast feed.  Explained transient nipple tenderness that should get better in a few days.  Mom had lots of lactation questions which were addressed.  Mom reassured and praised which seemed to ease some of her anxiety.  Lactation name and number on white board and encouraged call with any questions, concerns or assistance.  Maternal Data Formula Feeding for Exclusion: No Has patient been taught Hand Expression?: Yes Does the patient have breastfeeding experience prior to this delivery?: Yes  Feeding Feeding Type: Breast Fed  LATCH Score Latch: Repeated attempts needed to sustain latch, nipple held in mouth throughout feeding, stimulation needed to elicit sucking reflex.  Audible Swallowing: A few with stimulation  Type of Nipple: Everted at rest and after stimulation  Comfort (Breast/Nipple): Soft / non-tender  Hold (Positioning): No assistance needed to correctly position infant at breast.  LATCH Score: 8  Interventions Interventions: Breast compression  Lactation Tools Discussed/Used WIC Program: No(BCBS)   Consult Status Consult Status: Follow-up Date: 10/20/17 Follow-up type: Call as needed    Louis Meckel 10/20/2017, 12:51 PM

## 2017-10-20 NOTE — Progress Notes (Signed)
PPD#1 SVD Subjective:  Fatigued but cheerful and well-appearing. Breastfeeding newborn with football hold.  Pain control is good with PO medications. Voiding without difficulty. Tolerating a regular diet. Ambulating well. Some muscle soreness in arms/chest from laboring.  Objective:   Blood pressure 102/68, pulse 85, temperature (!) 97.4 F (36.3 C), temperature source Oral, resp. rate 18, height 5\' 6"  (1.676 m), weight 91.6 kg, last menstrual period 01/10/2017, SpO2 97 %.  General: NAD Pulmonary: no increased work of breathing Abdomen: non-distended, non-tender Uterus:  fundus firm; lochia appropriate Extremities: no edema, no erythema, no tenderness, no signs of DVT  Results for orders placed or performed during the hospital encounter of 10/19/17 (from the past 72 hour(s))  CBC     Status: Abnormal   Collection Time: 10/19/17  4:54 AM  Result Value Ref Range   WBC 11.9 (H) 3.6 - 11.0 K/uL   RBC 4.10 3.80 - 5.20 MIL/uL   Hemoglobin 12.7 12.0 - 16.0 g/dL   HCT 16.1 09.6 - 04.5 %   MCV 90.2 80.0 - 100.0 fL   MCH 30.9 26.0 - 34.0 pg   MCHC 34.3 32.0 - 36.0 g/dL   RDW 40.9 (H) 81.1 - 91.4 %   Platelets 188 150 - 440 K/uL    Comment: Performed at [facility], [address] [place], [place] [address]  Type and screen     Status: None   Collection Time: 10/19/17  6:20 AM  Result Value Ref Range   ABO/RH(D) A POS    Antibody Screen NEG    Sample Expiration      10/22/2017 Performed at [facility], [address] [place], [place] [address]   CBC     Status: Abnormal   Collection Time: 10/20/17  3:43 AM  Result Value Ref Range   WBC 10.2 3.6 - 11.0 K/uL   RBC 3.56 (L) 3.80 - 5.20 MIL/uL   Hemoglobin 11.1 (L) 12.0 - 16.0 g/dL   HCT 86.5 (L) 78.4 - 69.6 %   MCV 91.2 80.0 - 100.0 fL   MCH 31.3 26.0 - 34.0 pg   MCHC 34.3 32.0 - 36.0 g/dL   RDW 29.5 (H) 28.4 - 13.2 %   Platelets 178 150 - 440 K/uL    Comment: Performed at [facility], [address] [place], [place] [address]    Assessment:   32 y.o. G2P1001 postpartum day # 1 in good condition.  Plan:   1) Acute blood loss anemia - hemodynamically stable and asymptomatic. PO ferrous sulfate/vitamins  2) Blood Type --/--/A POS (10/06 0620) /   3) Rubella 1.20 (02/11 1529) / Varicella Immune / TDAP status: declined  4) Breastfeeding  5) Contraception: considering vasectomy, natural family planning in the meantime.  6) Disposition: continue postpartum care. Anticipate discharge tomorrow.  Marcelyn Bruins, CNM 10/20/2017  10:07 AM

## 2017-10-21 LAB — RPR: RPR Ser Ql: NONREACTIVE

## 2017-10-21 NOTE — Progress Notes (Signed)
Pt discharged with infant.  Discharge instructions, prescriptions and follow up appointment given to and reviewed with pt. Pt verbalized understanding.  

## 2017-10-21 NOTE — Lactation Note (Signed)
[address] HeatereMStephanMarland Kitchen MinisterhenReola Mosher line pilotDoran HeaterEstoniaStDTrisha [phone] chmond LLCocente SallesG>67AiDema Severine piloKennon Porte >3Trisha MangleDonzetta StarchEstonia

## 2017-10-22 ENCOUNTER — Telehealth: Payer: Self-pay | Admitting: Obstetrics and Gynecology

## 2017-10-22 NOTE — Telephone Encounter (Signed)
Patient calling to schedule 6 week PP. Patient was advised at hospital to schedule with Dr. Jean Rosenthal for 2 week PP. Patient refuses to schedule appointment. Patient is schedule 12/01/17 with SDJ. Please advise

## 2017-10-22 NOTE — Telephone Encounter (Signed)
If patient refuses, there is nothing we can do.  A 6 week postpartum will have to do. If she has any issues, she can schedule an appointment prior to her appt on 11/18.

## 2017-10-26 ENCOUNTER — Other Ambulatory Visit: Payer: Self-pay | Admitting: Obstetrics and Gynecology

## 2017-10-26 DIAGNOSIS — N61 Mastitis without abscess: Secondary | ICD-10-CM

## 2017-10-26 MED ORDER — DICLOXACILLIN SODIUM 500 MG PO CAPS: 500.0000 mg | ORAL_CAPSULE | Freq: Four times a day (QID) | ORAL | 0 refills | Status: DC

## 2017-10-26 NOTE — Progress Notes (Signed)
Lactation consultant called after speaking with the patient on the phone. She is having issues with engorgement and has been pumping milk in addition to having her infant breast feed. She developed a fever last night with pain all over her body and flu like symptoms. She has not noticed areas or redness of the skin. She reports that just moving her hand causes pain in her breasts. Discussed wearing a tight bra and cool compresses after breast feeding. Will start on antibiotics. Follow up tomorrow in clinic for evaluation.  Adelene Idler MD [facility], [facility] 10/26/17 2:27 PM

## 2017-10-27 ENCOUNTER — Telehealth: Payer: Self-pay | Admitting: Obstetrics and Gynecology

## 2017-10-27 NOTE — Telephone Encounter (Signed)
Voicemail box is full unable to leave message. Sending message through [employer]

## 2017-10-27 NOTE — Telephone Encounter (Signed)
-----   Message from Natale Milch, MD sent at 10/26/2017  2:28 PM EDT ----- Please call this patient and add her on to the schedule tomorrow for an appointment related to mastitis/engorgment pain.  Thank you,  Dr. Jerene Pitch

## 2017-10-27 NOTE — Telephone Encounter (Signed)
Patient is calling to let Dr. Jerene Pitch know she is feeling better and doesn't feel like she needs to come in. Advise patient to call back if her symptoms change and she needs to come in to be seen

## 2017-12-01 ENCOUNTER — Ambulatory Visit: Payer: BLUE CROSS/BLUE SHIELD | Admitting: Obstetrics and Gynecology

## 2017-12-01 ENCOUNTER — Encounter: Payer: Self-pay | Admitting: Obstetrics and Gynecology

## 2017-12-01 ENCOUNTER — Ambulatory Visit (INDEPENDENT_AMBULATORY_CARE_PROVIDER_SITE_OTHER): Payer: BLUE CROSS/BLUE SHIELD | Admitting: Obstetrics and Gynecology

## 2017-12-01 NOTE — Progress Notes (Signed)
Postpartum Visit  Chief Complaint: Six week postpartum follow up  History of Present Illness: Patient is a [age] y.o. G2P1001 presents for postpartum visit.  Date of delivery: 10/19/2017 Type of delivery: Vaginal delivery - Vacuum or forceps assisted  no Episiotomy No.  Laceration: no Pregnancy or labor problems:  no Any problems since the delivery:  mastitis  Newborn Details:  SINGLETON :  1. Baby's name: joseph elias. Birth weight: 8.4lb Maternal Details:  Breast Feeding:  yes Post partum depression/anxiety noted:  no Edinburgh Post-Partum Depression Score:  2  Date of last PAP: 03/29/2016/NORMAL, HPV negative  Past Medical History:  Diagnosis Date  . Alpha-1-antitrypsin deficiency (HCC)   . Anxiety   . GERD (gastroesophageal reflux disease)   . Hemorrhoids   . Mitral valve prolapse     Past Surgical History:  Procedure Laterality Date  . ESOPHAGOGASTRODUODENOSCOPY ENDOSCOPY      Prior to Admission medications   Medication Sig Start Date End Date Taking? Authorizing Provider  B Complex Vitamins (VITAMIN B COMPLEX PO) Take by mouth.    [provider]  Cholecalciferol (VITAMIN D3) 1000 units CAPS Take 1 capsule by mouth daily.    [provider]  Ferrous Sulfate (IRON) 325 (65 Fe) MG TABS  07/16/17   [provider]  omega-3 acid ethyl esters (LOVAZA) 1 g capsule Take by mouth.    [provider]  Prenatal Ca Carb-B6-B12-FA (BP FOLINATAL PLUS B) 1 MG TABS Take by mouth.    [provider]    Allergies  Allergen Reactions  . Brompheniramine-Pseudoeph Hives  . Clarithromycin Hives  . Robitussin (Alcohol Free) [Guaifenesin] Hives  . Sulfa Antibiotics Hives     Social History   Socioeconomic History  . Marital status: Married    Spouse name: Not on file  . Number of children: Not on file  . Years of education: Not on file  . Highest education level: Not on file  Occupational History  . Not on file  Social Needs  .  Financial resource strain: Not hard at all  . Food insecurity:    Worry: Never true    Inability: Never true  . Transportation needs:    Medical: No    Non-medical: No  Tobacco Use  . Smoking status: Never Smoker  . Smokeless tobacco: Never Used  Substance and Sexual Activity  . Alcohol use: No  . Drug use: No  . Sexual activity: Yes    Birth control/protection: None    Comment: undecided  Lifestyle  . Physical activity:    Days per week: 3 days    Minutes per session: 30 min  . Stress: To some extent  Relationships  . Social connections:    Talks on phone: Twice a week    Gets together: Once a week    Attends religious service: 1 to 4 times per year    Active member of club or organization: No    Attends meetings of clubs or organizations: Never    Relationship status: Married  . Intimate partner violence:    Fear of current or ex partner: No    Emotionally abused: No    Physically abused: No    Forced sexual activity: No  Other Topics Concern  . Not on file  Social History Narrative  . Not on file    Family History  Problem Relation Age of Onset  . Hypertension Father   . Prostate cancer Paternal Uncle   . Breast cancer Other   .  Breast cancer Maternal Aunt 52       stage 0    Review of Systems  Constitutional: Negative.   HENT: Negative.   Eyes: Negative.   Respiratory: Negative.   Cardiovascular: Negative.   Gastrointestinal: Negative.   Genitourinary: Negative.   Musculoskeletal: Negative.   Skin: Negative.   Neurological: Negative.   Psychiatric/Behavioral: Negative.      Physical Exam BP 118/74   Ht 5\' 6"  (1.676 m)   Wt 190 lb (86.2 kg)   LMP 11/26/2017   BMI 30.67 kg/m   Physical Exam  Constitutional: She is oriented to person, place, and time. She appears well-developed and well-nourished. No distress.  Genitourinary: Vagina normal and uterus normal. Pelvic exam was performed with patient supine. There is no rash, tenderness or lesion  on the right labia. There is no rash, tenderness or lesion on the left labia. Right adnexum does not display mass, does not display tenderness and does not display fullness. Left adnexum does not display mass, does not display tenderness and does not display fullness. Cervix does not exhibit motion tenderness or lesion.   Uterus is mobile. Uterus is not enlarged, tender, exhibiting a mass or irregular (is regular).  HENT:  Head: Normocephalic and atraumatic.  Eyes: EOM are normal. No scleral icterus.  Neck: Normal range of motion. Neck supple.  Cardiovascular: Normal rate and regular rhythm.  Pulmonary/Chest: Effort normal and breath sounds normal. No respiratory distress. She has no wheezes. She has no rales.  Abdominal: Soft. Bowel sounds are normal. She exhibits no distension and no mass. There is no tenderness. There is no rebound and no guarding.  Musculoskeletal: Normal range of motion. She exhibits no edema.  Neurological: She is alert and oriented to person, place, and time. No cranial nerve deficit.  Skin: Skin is warm and dry. No erythema.  Psychiatric: She has a normal mood and affect. Her behavior is normal. Judgment normal.     Female Chaperone present during breast and/or pelvic exam.  Assessment: 32 y.o. G2P1001 presenting for 6 week postpartum visit  Plan: Problem List Items Addressed This Visit    None    Visit Diagnoses    Postpartum care and examination    -  Primary       1) Contraception Education given regarding options for contraception, including husband to get vasectomy.  2)  Pap - ASCCP guidelines and rational discussed.  Patient opts for routine screening interval. Not due this year.  3) Patient underwent screening for postpartum depression with no concerns noted.  4) Follow up 1 year for routine annual exam  Thomasene Mohair, MD 12/01/2017 9:39 AM

## 2017-12-04 ENCOUNTER — Ambulatory Visit: Payer: BLUE CROSS/BLUE SHIELD

## 2018-02-18 DIAGNOSIS — D225 Melanocytic nevi of trunk: Secondary | ICD-10-CM | POA: Diagnosis not present

## 2018-02-18 DIAGNOSIS — M7981 Nontraumatic hematoma of soft tissue: Secondary | ICD-10-CM | POA: Diagnosis not present

## 2018-02-18 DIAGNOSIS — L821 Other seborrheic keratosis: Secondary | ICD-10-CM | POA: Diagnosis not present

## 2018-02-18 DIAGNOSIS — L84 Corns and callosities: Secondary | ICD-10-CM | POA: Diagnosis not present

## 2018-02-25 DIAGNOSIS — R42 Dizziness and giddiness: Secondary | ICD-10-CM | POA: Diagnosis not present

## 2018-02-25 DIAGNOSIS — R202 Paresthesia of skin: Secondary | ICD-10-CM | POA: Diagnosis not present

## 2018-02-25 DIAGNOSIS — R51 Headache: Secondary | ICD-10-CM | POA: Diagnosis not present

## 2018-04-23 ENCOUNTER — Other Ambulatory Visit: Payer: Self-pay | Admitting: Obstetrics and Gynecology

## 2018-04-23 DIAGNOSIS — N61 Mastitis without abscess: Secondary | ICD-10-CM

## 2018-04-23 MED ORDER — DICLOXACILLIN SODIUM 500 MG PO CAPS: 500.0000 mg | ORAL_CAPSULE | Freq: Four times a day (QID) | ORAL | 0 refills | Status: AC

## 2018-06-15 ENCOUNTER — Ambulatory Visit: Payer: BLUE CROSS/BLUE SHIELD | Admitting: Obstetrics and Gynecology

## 2018-06-18 ENCOUNTER — Ambulatory Visit (INDEPENDENT_AMBULATORY_CARE_PROVIDER_SITE_OTHER): Payer: BC Managed Care – PPO | Admitting: Obstetrics and Gynecology

## 2018-06-18 ENCOUNTER — Other Ambulatory Visit: Payer: Self-pay

## 2018-06-18 ENCOUNTER — Encounter: Payer: Self-pay | Admitting: Obstetrics and Gynecology

## 2018-06-18 VITALS — BP 120/72 | Ht 66.0 in | Wt 187.0 lb

## 2018-06-18 DIAGNOSIS — R300 Dysuria: Secondary | ICD-10-CM

## 2018-06-18 LAB — POCT URINALYSIS DIPSTICK
Appearance: NORMAL
Bilirubin, UA: NEGATIVE
Blood, UA: NEGATIVE
Glucose, UA: NEGATIVE
Ketones, UA: NEGATIVE
Nitrite, UA: NEGATIVE
Odor: NORMAL
Protein, UA: NEGATIVE
Spec Grav, UA: 1.025 (ref 1.010–1.025)
Urobilinogen, UA: 0.2 E.U./dL
pH, UA: 5 (ref 5.0–8.0)

## 2018-06-18 NOTE — Progress Notes (Signed)
Obstetrics & Gynecology Office Visit   Chief Complaint  Patient presents with  . Gynecologic Exam    burning w/urination that doesn't feel like UTI   History of Present Illness: 33 y.o. 532P2002 female who presents for several symptoms. She states that in March she and her husband began having unprotected sex.  This is due to the fact that he had a vasectomy in December.  After intercourse she would noticed that she had bloating and tenderness in her abdomen.  1 month ago she also started noticing a slight burn when she urinated.  This was not with every time she voided.  She states that the urine comes out without pain.  However, when it hits skin that is when she notices the burn.  She states that her discharge might be a little different.  After her most recent menses things did not change.  She tried using over-the-counter Monistat.  This caused a great deal of burning and irritation to her vagina.  She did wait a week and then she had 3 days of no symptoms.  However, she and her husband had sex again and her symptoms returned.  The issue that bothers her the most is the burning sensation with urination.  She does not really have any abnormal symptoms or discharge when she is not voiding.  Her last Pap smear was in March 2018 and was normal with negative HPV.  Past Medical History:  Diagnosis Date  . Alpha-1-antitrypsin deficiency (HCC)   . Anxiety   . GERD (gastroesophageal reflux disease)   . Hemorrhoids   . Mitral valve prolapse     Past Surgical History:  Procedure Laterality Date  . ESOPHAGOGASTRODUODENOSCOPY ENDOSCOPY      Gynecologic History: Patient's last menstrual period was 05/29/2018.  Obstetric History: [id number]  Family History  Problem Relation Age of Onset  . Hypertension Father   . Prostate cancer Paternal Uncle   . Breast cancer Other   . Breast cancer Maternal Aunt 52       stage 0    Social History   Socioeconomic History  . Marital status: Married   Spouse name: Not on file  . Number of children: Not on file  . Years of education: Not on file  . Highest education level: Not on file  Occupational History  . Not on file  Social Needs  . Financial resource strain: Not hard at all  . Food insecurity:    Worry: Never true    Inability: Never true  . Transportation needs:    Medical: No    Non-medical: No  Tobacco Use  . Smoking status: Never Smoker  . Smokeless tobacco: Never Used  Substance and Sexual Activity  . Alcohol use: No  . Drug use: No  . Sexual activity: Yes    Birth control/protection: None    Comment: undecided  Lifestyle  . Physical activity:    Days per week: 3 days    Minutes per session: 30 min  . Stress: To some extent  Relationships  . Social connections:    Talks on phone: Twice a week    Gets together: Once a week    Attends religious service: 1 to 4 times per year    Active member of club or organization: No    Attends meetings of clubs or organizations: Never    Relationship status: Married  . Intimate partner violence:    Fear of current or ex partner: No    Emotionally abused:  No    Physically abused: No    Forced sexual activity: No  Other Topics Concern  . Not on file  Social History Narrative  . Not on file    Allergies  Allergen Reactions  . Brompheniramine-Pseudoeph Hives  . Clarithromycin Hives  . Robitussin (Alcohol Free) [Guaifenesin] Hives  . Sulfa Antibiotics Hives    Prior to Admission medications   Medication Sig Start Date End Date Taking? Authorizing Provider  B Complex Vitamins (VITAMIN B COMPLEX PO) Take by mouth.   Yes [provider]  Cholecalciferol (VITAMIN D3) 1000 units CAPS Take 1 capsule by mouth daily.   Yes [provider]  omega-3 acid ethyl esters (LOVAZA) 1 g capsule Take by mouth.   Yes [provider]  Prenatal Ca Carb-B6-B12-FA (BP FOLINATAL PLUS B) 1 MG TABS Take by mouth.   Yes [provider]  Ferrous Sulfate  (IRON) 325 (65 Fe) MG TABS  07/16/17   [provider]    Review of Systems  Constitutional: Negative.   HENT: Negative.   Eyes: Negative.   Respiratory: Negative.   Cardiovascular: Negative.   Gastrointestinal: Positive for abdominal pain (tenderness in lower abdomen after intercourse). Negative for blood in stool, constipation, diarrhea, heartburn, melena, nausea and vomiting.       Also has bloating  Genitourinary: Positive for dysuria. Negative for flank pain, frequency, hematuria and urgency.  Musculoskeletal: Negative.   Skin: Negative.   Neurological: Negative.   Psychiatric/Behavioral: Negative.      Physical Exam BP 120/72 (BP Location: Left Arm, Patient Position: Sitting, Cuff Size: Normal)   Ht 5\' 6"  (1.676 m)   Wt 187 lb (84.8 kg)   LMP 05/29/2018   BMI 30.18 kg/m  Patient's last menstrual period was 05/29/2018. Physical Exam Constitutional:      General: She is not in acute distress.    Appearance: Normal appearance. She is well-developed.  Genitourinary:     Pelvic exam was performed with patient in the lithotomy position.     Vulva, inguinal canal, urethra, bladder, vagina, uterus, right adnexa and left adnexa normal.     No posterior fourchette tenderness, injury or lesion present.        No cervical friability, lesion, bleeding or polyp.  HENT:     Head: Normocephalic and atraumatic.  Eyes:     General: No scleral icterus.    Conjunctiva/sclera: Conjunctivae normal.  Neck:     Musculoskeletal: Normal range of motion and neck supple.  Cardiovascular:     Rate and Rhythm: Normal rate and regular rhythm.     Heart sounds: No murmur. No friction rub. No gallop.   Pulmonary:     Effort: Pulmonary effort is normal. No respiratory distress.     Breath sounds: Normal breath sounds. No wheezing or rales.  Abdominal:     General: Bowel sounds are normal. There is no distension.     Palpations: Abdomen is soft. There is no mass.     Tenderness: There  is no abdominal tenderness. There is no guarding or rebound.  Musculoskeletal: Normal range of motion.  Neurological:     General: No focal deficit present.     Mental Status: She is alert and oriented to person, place, and time.     Cranial Nerves: No cranial nerve deficit.  Skin:    General: Skin is warm and dry.     Findings: No erythema.  Psychiatric:        Mood and Affect: Mood  normal.        Behavior: Behavior normal.        Judgment: Judgment normal.    Female chaperone present for pelvic and breast  portions of the physical exam  Wet Prep: PH: 5.0 Clue Cells: Negative Fungal elements: Negative Trichomonas: Negative   Assessment: 33 y.o. G29P1001 female here for  1. Dysuria      Plan: Problem List Items Addressed This Visit    None    Visit Diagnoses    Dysuria    -  Primary   Relevant Orders   POCT Urinalysis Dipstick (Completed)   Urine Culture     Her symptoms are rather nonspecific.  They certainly do not point to any particular etiology, especially in the absence of any significant physical exam findings.  She states that she is low risk for sexually transmitted infections.  Therefore, no testing was performed.  Her wet prep was normal apart from a slightly elevated pH.  She continues to breast-feed her 62-month-old and does so very frequently during the day.  She does have menses occasionally.  I reassured her regarding exam findings today.  We talked about physiologic changes that go along with exclusive breast-feeding that may contribute to recurrent symptoms.  We did discuss a pelvic ultrasound as she seems concerned about ovarian pathology with her symptoms after intercourse.  This was offered to her.  However, she declined based on a normal exam.  She will use an over-the-counter pH balancing medication initially.  However, she may want to be treated with Flagyl if her symptoms persist.  20 minutes spent in face to face discussion with > 50% spent in  counseling,management, and coordination of care of her dysuria.   Thomasene Mohair, MD 06/18/2018 5:44 PM

## 2018-06-19 DIAGNOSIS — R300 Dysuria: Secondary | ICD-10-CM | POA: Diagnosis not present

## 2018-06-21 LAB — URINE CULTURE

## 2018-06-22 ENCOUNTER — Telehealth: Payer: Self-pay

## 2018-06-22 NOTE — Telephone Encounter (Signed)
Pt calling; received email from [facility] Friday that urine cx is in but she doesn't see it on the portal.  Please call with results.  [phone]

## 2018-06-23 ENCOUNTER — Other Ambulatory Visit: Payer: Self-pay | Admitting: Obstetrics and Gynecology

## 2018-06-23 DIAGNOSIS — N3000 Acute cystitis without hematuria: Secondary | ICD-10-CM

## 2018-06-23 MED ORDER — CEPHALEXIN 500 MG PO TABS: 500.0000 mg | ORAL_TABLET | Freq: Four times a day (QID) | ORAL | 0 refills | Status: AC

## 2018-06-23 MED ORDER — NITROFURANTOIN MONOHYD MACRO 100 MG PO CAPS: 100.0000 mg | ORAL_CAPSULE | Freq: Two times a day (BID) | ORAL | 0 refills | Status: DC

## 2018-06-23 NOTE — Telephone Encounter (Signed)
It is by no means definitive.  Given the severity of her symptoms, I will go ahead and treat. Please let her know that I will send in something. Thanks!

## 2018-06-24 DIAGNOSIS — L578 Other skin changes due to chronic exposure to nonionizing radiation: Secondary | ICD-10-CM | POA: Diagnosis not present

## 2018-06-24 DIAGNOSIS — D1800 Hemangioma unspecified site: Secondary | ICD-10-CM | POA: Diagnosis not present

## 2018-06-24 DIAGNOSIS — Z86018 Personal history of other benign neoplasm: Secondary | ICD-10-CM | POA: Diagnosis not present

## 2018-06-24 DIAGNOSIS — L249 Irritant contact dermatitis, unspecified cause: Secondary | ICD-10-CM | POA: Diagnosis not present

## 2018-06-29 ENCOUNTER — Encounter: Payer: Self-pay | Admitting: Obstetrics and Gynecology

## 2018-06-29 ENCOUNTER — Other Ambulatory Visit (HOSPITAL_COMMUNITY)
Admission: RE | Admit: 2018-06-29 | Discharge: 2018-06-29 | Disposition: A | Discharge status: Home / Self Care | Payer: BC Managed Care – PPO | Source: Ambulatory Visit | Attending: Obstetrics and Gynecology | Admitting: Obstetrics and Gynecology

## 2018-06-29 ENCOUNTER — Telehealth: Payer: Self-pay

## 2018-06-29 ENCOUNTER — Ambulatory Visit (INDEPENDENT_AMBULATORY_CARE_PROVIDER_SITE_OTHER): Payer: BC Managed Care – PPO | Admitting: Obstetrics and Gynecology

## 2018-06-29 ENCOUNTER — Other Ambulatory Visit: Payer: Self-pay

## 2018-06-29 VITALS — BP 130/78 | HR 112 | Ht 66.0 in | Wt 183.0 lb

## 2018-06-29 DIAGNOSIS — R102 Pelvic and perineal pain: Secondary | ICD-10-CM | POA: Diagnosis not present

## 2018-06-29 DIAGNOSIS — Z124 Encounter for screening for malignant neoplasm of cervix: Secondary | ICD-10-CM | POA: Insufficient documentation

## 2018-06-29 DIAGNOSIS — N771 Vaginitis, vulvitis and vulvovaginitis in diseases classified elsewhere: Secondary | ICD-10-CM | POA: Diagnosis not present

## 2018-06-29 NOTE — Telephone Encounter (Signed)
Patient was evaluated by Dr. Gilman Schmidt today. Will disregard.

## 2018-06-29 NOTE — Progress Notes (Signed)
Patient ID: Gabrielle Mcdowell, female   DOB: [birth year], 33 y.o.   MRN: [id number]  Reason for Consult: Vaginal Pain (low grade fever, nausea, burning, and would like blood work)   Referred by Gabrielle Mcdowell, Javed, MD  Subjective:     HPI:  Gabrielle Mcdowell is a 33 y.o. female . She presents today with several complaints. Her largest concern is dysuria which has been persistent for several weeks. She is currently taking keflex for a possible UTI although she notes no change in her symptoms. She felt a lymph node in her right groin today which was enlarged. She reports that it hurts when the seam of there underwear rubs against it. She also noticed several small bumps on the inside of her labia and inside the vaginal wall when she touched with her fingers. She has also been having vague complaints of bloating and inflammation. She feels like she has a low grade fever and has been flush. She has been checking her urine at home and notes that both her in the office and at home it was positive for leukocytes. She notes that she has had a  Temperature of 99 degrees at home. She denies any respiratory symptoms of couch or shortness of breath. Denies loss of taste or loss of smell. Denies COVID-19 exposures.   She notes fatigue as well. 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  Her infant wakes her every 2-3 hours to breastfeed.  She has a lot of questions about what medications and probiotics are acceptable with breast feeding. She would like to avoid parabans. She recently changed to well water.   Past Medical History:  Diagnosis Date  . Alpha-1-antitrypsin deficiency (HCC)   . Anxiety   . GERD (gastroesophageal reflux disease)   . Hemorrhoids   . Mitral valve prolapse    Family History  Problem Relation Age of Onset  . Hypertension Father   . Prostate cancer Paternal Uncle   . Breast cancer  Other   . Breast cancer Maternal Aunt 52       stage 0   Past Surgical History:  Procedure Laterality Date  . ESOPHAGOGASTRODUODENOSCOPY ENDOSCOPY      Short Social History:  Social History   Tobacco Use  . Smoking status: Never Smoker  . Smokeless tobacco: Never Used  Substance Use Topics  . Alcohol use: No    Allergies  Allergen Reactions  . Brompheniramine-Pseudoeph Hives  . Clarithromycin Hives  . Robitussin (Alcohol Free) [Guaifenesin] Hives  . Sulfa Antibiotics Hives    Current Outpatient Medications  Medication Sig Dispense Refill  . B Complex Vitamins (VITAMIN B COMPLEX PO) Take by mouth.    . Cephalexin 500 MG tablet Take 1 tablet (500 mg total) by mouth 4 (four) times daily for 7 days. 28 tablet 0  . Cholecalciferol (VITAMIN D3) 1000 units CAPS Take 1 capsule by mouth daily.    Marland Kitchen. omega-3 acid ethyl esters (LOVAZA) 1 g capsule Take by mouth.    . Prenatal Ca Carb-B6-B12-FA (BP FOLINATAL PLUS B) 1 MG TABS Take by mouth.     No current facility-administered medications for this visit.     Review of Systems  Constitutional: Positive for fatigue and fever. Negative for chills and unexpected weight change.  HENT: Negative for trouble swallowing.  Eyes: Negative for loss of vision.  Respiratory: Negative for cough, shortness of  breath and wheezing.  Cardiovascular: Negative for chest pain, leg swelling, palpitations and syncope.  GI: Positive for nausea. Negative for abdominal pain, blood in stool, diarrhea and vomiting.  GU: Positive for dysuria. Negative for difficulty urinating, frequency and hematuria.  Musculoskeletal: Negative for back pain, leg pain and joint pain.  Skin: Negative for rash.  Neurological: Negative for dizziness, headaches, light-headedness, numbness and seizures.  Psychiatric: Negative for behavioral problem, confusion, depressed mood and sleep disturbance.        Objective:  Objective   Vitals:   06/29/18 1357  BP: 130/78  Pulse:  (!) 112  Weight: 183 lb (83 kg)  Height: 5\' 6"  (1.676 m)   Body mass index is 29.54 kg/m.  Physical Exam Vitals signs and nursing note reviewed.  Constitutional:      Appearance: She is well-developed.  HENT:     Head: Normocephalic and atraumatic.  Eyes:     Pupils: Pupils are equal, round, and reactive to light.  Cardiovascular:     Rate and Rhythm: Normal rate and regular rhythm.  Pulmonary:     Effort: Pulmonary effort is normal. No respiratory distress.  Genitourinary:    Comments: Normal speculum exam. Normal cervix. Normal discharge. No bleeding. No CMT. No uterine tenderness. No adnexal tenderness. Normal appearance of vulva and perineum. No lesions or warts. Skin:    General: Skin is warm and dry.  Neurological:     Mental Status: She is alert and oriented to person, place, and time.  Psychiatric:        Behavior: Behavior normal.        Thought Content: Thought content normal.        Judgment: Judgment normal.         Assessment/Plan:     33 yo with dysuria, concern for swollen pelvic lymph node  1. Urine culture showed mixed urogential flora before. She is taking keflex Her symptoms have continued. Will resend urine culture. She reports she does not currently drink any tea or coffee. She will cut out carbonated water. She will trial OTC azo to see if she has any improvement in symptoms.  No blood in urine that might suggest a stone.  2. Lymph node- normal in size. Not tender to palpation. Reassurance. 3. Vaginitis, recently normal wet mount- will send nuswab to confirm, normal vaginal discharge on examination. Discussed oral probiotics with lactobacillus 4. Largely negative evaluation today in office. If symptoms continue could consider referral to urology for dysuria evaluation- could consider interstitial cystitis etc. Although symptoms generally recent in onset. Some concerns for bloating and inflammation, could also consider GI referral if needed.  5. Pap smear  collected at patient request. Pap in 2018 NIL.   At this time advised patient to try and improve sleep, sitz baths one a day with epsom salts for 1 week, and to try and just drink water and cut out carbonated beverages for 1 week to see if this helps.   More than 40 minutes were spent face to face with the patient in the room with more than 50% of the time spent providing counseling and discussing the plan of management.    Will follow up with patient on the phone in 2 weeks.   Adrian Prows MD [facility] 06/29/2018 2:58 PM

## 2018-06-29 NOTE — Telephone Encounter (Signed)
Pt called after hour nurse 06/29/18 at 12:16am c/o rx'd antibx for UTI; the glands in the groin area are swollen;  Pt has taken 5d of cephalexin.  Pt feels warm and has pain when she urinates.  [phone]

## 2018-06-30 LAB — CBC
Hematocrit: 41.3 % (ref 34.0–46.6)
Hemoglobin: 14.1 g/dL (ref 11.1–15.9)
MCH: 30.4 pg (ref 26.6–33.0)
MCHC: 34.1 g/dL (ref 31.5–35.7)
MCV: 89 fL (ref 79–97)
Platelets: 303 10*3/uL (ref 150–450)
RBC: 4.64 x10E6/uL (ref 3.77–5.28)
RDW: 12.3 % (ref 11.7–15.4)
WBC: 7.2 10*3/uL (ref 3.4–10.8)

## 2018-06-30 NOTE — Progress Notes (Signed)
Hi Gabrielle Mcdowell,  Here is your CBC. It is normal. This is great news. I hope you are feeling better. I will let you know as your other lab tests come back.  Sincerely,  Dr. Gilman Schmidt

## 2018-07-01 LAB — URINE CULTURE

## 2018-07-01 NOTE — Progress Notes (Signed)
Negative, released to mychart

## 2018-07-02 LAB — CYTOLOGY - PAP
Diagnosis: NEGATIVE
HPV: NOT DETECTED

## 2018-07-02 LAB — NUSWAB VAGINITIS PLUS (VG+)
Candida albicans, NAA: NEGATIVE
Candida glabrata, NAA: NEGATIVE
Chlamydia trachomatis, NAA: NEGATIVE
Neisseria gonorrhoeae, NAA: NEGATIVE
Trich vag by NAA: NEGATIVE

## 2018-07-02 NOTE — Progress Notes (Signed)
Negative, Released to mychart 

## 2018-07-05 ENCOUNTER — Other Ambulatory Visit: Payer: Self-pay | Admitting: Advanced Practice Midwife

## 2018-07-05 DIAGNOSIS — O9122 Nonpurulent mastitis associated with the puerperium: Secondary | ICD-10-CM

## 2018-07-05 MED ORDER — DICLOXACILLIN SODIUM 500 MG PO CAPS: 500.0000 mg | ORAL_CAPSULE | Freq: Four times a day (QID) | ORAL | 0 refills | Status: AC

## 2018-07-05 NOTE — Progress Notes (Signed)
Rx dicloxacillin sent to patient pharmacy per her request for recurring mastitis. She reports redness/warmth/tenderness in right breast.

## 2018-07-06 ENCOUNTER — Telehealth: Payer: Self-pay

## 2018-07-06 NOTE — Telephone Encounter (Signed)
Pt called after hour nurse 6/21 at 12?24am c/o mastitis sxs.  JEG called in an antibx.  Called to f/u - mailbox full. [phone]

## 2018-07-07 NOTE — Telephone Encounter (Signed)
Second attempt to call pt - mailbox is full.

## 2018-07-09 ENCOUNTER — Other Ambulatory Visit: Payer: Self-pay | Admitting: Obstetrics and Gynecology

## 2018-07-09 DIAGNOSIS — N94819 Vulvodynia, unspecified: Secondary | ICD-10-CM

## 2018-07-09 MED ORDER — LIDOCAINE 5 % EX OINT: 1.0000 "application " | TOPICAL_OINTMENT | Freq: Three times a day (TID) | CUTANEOUS | 6 refills | Status: DC | PRN

## 2018-07-13 ENCOUNTER — Other Ambulatory Visit: Payer: Self-pay

## 2018-07-13 ENCOUNTER — Ambulatory Visit (INDEPENDENT_AMBULATORY_CARE_PROVIDER_SITE_OTHER): Payer: BC Managed Care – PPO | Admitting: Obstetrics and Gynecology

## 2018-07-13 ENCOUNTER — Encounter: Payer: Self-pay | Admitting: Obstetrics and Gynecology

## 2018-07-13 DIAGNOSIS — N94819 Vulvodynia, unspecified: Secondary | ICD-10-CM

## 2018-07-13 DIAGNOSIS — R300 Dysuria: Secondary | ICD-10-CM

## 2018-07-13 DIAGNOSIS — R102 Pelvic and perineal pain: Secondary | ICD-10-CM

## 2018-07-13 DIAGNOSIS — N61 Mastitis without abscess: Secondary | ICD-10-CM

## 2018-07-13 NOTE — Progress Notes (Signed)
Virtual Visit via Telephone Note  I connected with Gabrielle Mcdowell on 07/13/18 at  4:10 PM EDT by telephone and verified that I am speaking with the correct person using two identifiers.   I discussed the limitations, risks, security and privacy concerns of performing an evaluation and management service by telephone and the availability of in person appointments. I also discussed with the patient that there may be a patient responsible charge related to this service. The patient expressed understanding and agreed to proceed.  The patient was at home I spoke with the patient from my  office The names of people involved in this encounter were: Gabrielle Mcdowell and Dr. Gilman Schmidt.   History of Present Illness: She is being evaluated for vulvar pain. She has not had a visibile lesion on office examination. She has tried application of lidocaine to the spot before urination. She reports that this is working well.   She had additional concerns today regarding breastfeeding. She has had multiple episodes of mastitis and clogged milk ducts.    Observations/Objective:   Physical Exam could not be performed. Because of the COVID-19 outbreak this visit was performed over the phone and not in person.   Assessment and Plan: 33 yo with vulvar pain during urination and intercourse. 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 She will continue with application of lidocaine to the vulva which is helping as well as trailing petroleum jelly.    Discussed Lecithin supplementation and low fat diet to help with patient's recurrent clogged milk ducts and mastitis.   Follow Up Instructions:   Pain improved with lidocaine Will come in so we can review vulvar anatomy   I discussed the assessment and treatment plan with the patient. The patient was provided an opportunity to ask questions and all were answered. The patient agreed with the plan and  demonstrated an understanding of the instructions.   The patient was advised to call back or seek an in-person evaluation if the symptoms worsen or if the condition fails to improve as anticipated.  I provided 25 minutes of non-face-to-face time during this encounter.  Adrian Prows MD [facility] 07/13/2018 4:39 PM

## 2018-07-14 NOTE — Telephone Encounter (Signed)
Pt has spoken c CRS yesterday.  No mention of mastitis.  Msg closed.

## 2018-07-20 ENCOUNTER — Encounter: Payer: BC Managed Care – PPO | Admitting: Obstetrics and Gynecology

## 2018-07-27 DIAGNOSIS — L720 Epidermal cyst: Secondary | ICD-10-CM | POA: Diagnosis not present

## 2018-07-28 ENCOUNTER — Other Ambulatory Visit: Payer: Self-pay

## 2018-07-28 ENCOUNTER — Ambulatory Visit (INDEPENDENT_AMBULATORY_CARE_PROVIDER_SITE_OTHER): Payer: BC Managed Care – PPO | Admitting: Obstetrics and Gynecology

## 2018-07-28 ENCOUNTER — Encounter: Payer: Self-pay | Admitting: Obstetrics and Gynecology

## 2018-07-28 VITALS — BP 110/70 | HR 90 | Ht 66.0 in | Wt 185.0 lb

## 2018-07-28 DIAGNOSIS — N94819 Vulvodynia, unspecified: Secondary | ICD-10-CM

## 2018-07-28 NOTE — Progress Notes (Signed)
   Patient ID: Gabrielle Mcdowell, female   DOB: 1986/01/04, 33 y.o.   MRN: [id number]  Reason for Consult: Follow-up (wants to follow up about the same pain and make sure there is still nothing going on )   Referred by Gabrielle Athens, MD  Subjective:     HPI:  Gabrielle Mcdowell is a 33 y.o. female. She is having vulvar pain and is concerned there is an issue.    Past Medical History:  Diagnosis Date  . Alpha-1-antitrypsin deficiency ([facility])   . Anxiety   . GERD (gastroesophageal reflux disease)   . Hemorrhoids   . Mitral valve prolapse    Family History  Problem Relation Age of Onset  . Hypertension Father   . Breast cancer Other   . Breast cancer Maternal Aunt 52       stage 0  . Prostate cancer Maternal Uncle    Past Surgical History:  Procedure Laterality Date  . ESOPHAGOGASTRODUODENOSCOPY ENDOSCOPY      Short Social History:  Social History   Tobacco Use  . Smoking status: Never Smoker  . Smokeless tobacco: Never Used  Substance Use Topics  . Alcohol use: No    Allergies  Allergen Reactions  . Brompheniramine-Pseudoeph Hives  . Clarithromycin Hives  . Robitussin (Alcohol Free) [Guaifenesin] Hives  . Sulfa Antibiotics Hives    Current Outpatient Medications  Medication Sig Dispense Refill  . B Complex Vitamins (VITAMIN B COMPLEX PO) Take by mouth.    . Cholecalciferol (VITAMIN D3) 1000 units CAPS Take 1 capsule by mouth daily.    Marland Kitchen lidocaine (XYLOCAINE) 5 % ointment Apply 1 application topically 3 (three) times daily as needed. 35.44 g 6  . Prenatal Ca Carb-B6-B12-FA (BP FOLINATAL PLUS B) 1 MG TABS Take by mouth.    . Magnesium 100 MG CAPS Take 100 mg by mouth.     No current facility-administered medications for this visit.    REVIEW OF SYSTEMS      Objective:  Objective   Vitals:   07/28/18 1614  BP: 110/70  Pulse: 90  Weight: 185 lb (83.9 kg)  Height: 5\' 6"  (1.676 m)   Body mass index is 29.86 kg/m.  Physical  Exam  Assessment/Plan:     No visible vulvar lesions- reassurance   Adrian Prows MD [facility] 07/28/2018 5:25 PM

## 2018-08-18 NOTE — Telephone Encounter (Signed)
Is there a way to adjust the diagnosis to vaginitis? Thank you

## 2018-08-19 NOTE — Telephone Encounter (Signed)
sent 

## 2018-08-20 NOTE — Telephone Encounter (Signed)
Thank you :)

## 2018-08-20 NOTE — Telephone Encounter (Signed)
Called [facility] to update diagnose code, and rep told me she was able to update diagnose to Vaginitis N77.1 and re-file pts insurance.

## 2018-10-06 ENCOUNTER — Encounter: Payer: Self-pay | Admitting: Obstetrics and Gynecology

## 2018-10-06 NOTE — Telephone Encounter (Signed)
Patient is schedule 10/07/18 with [facility] at 4:30

## 2018-10-07 ENCOUNTER — Other Ambulatory Visit: Payer: Self-pay

## 2018-10-07 ENCOUNTER — Encounter: Payer: Self-pay | Admitting: Obstetrics and Gynecology

## 2018-10-07 ENCOUNTER — Ambulatory Visit (INDEPENDENT_AMBULATORY_CARE_PROVIDER_SITE_OTHER): Payer: BC Managed Care – PPO | Admitting: Obstetrics and Gynecology

## 2018-10-07 VITALS — BP 100/80 | Ht 66.0 in | Wt 185.0 lb

## 2018-10-07 DIAGNOSIS — R234 Changes in skin texture: Secondary | ICD-10-CM

## 2018-10-07 DIAGNOSIS — Z1239 Encounter for other screening for malignant neoplasm of breast: Secondary | ICD-10-CM

## 2018-10-07 DIAGNOSIS — H04123 Dry eye syndrome of bilateral lacrimal glands: Secondary | ICD-10-CM | POA: Diagnosis not present

## 2018-10-07 NOTE — Patient Instructions (Signed)
I value your feedback and entrusting us with your care. If you get a [facility] patient survey, I would appreciate you taking the time to let us know about your experience today. Thank you! 

## 2018-10-07 NOTE — Progress Notes (Addendum)
Cletis Athens, MD   Chief Complaint  Patient presents with  . Breast Exam    right breast around aerola pt notices "a dip" x few months, no tenderness    HPI:      [name] is a 33 y.o. G2P2002 who LMP was Patient's last menstrual period was 09/29/2018 (exact date)., presents today for "dip"/skin discoloration of RT areola, happening occas over past few months. Notices it in [place] and has taken pictures. Area more visible when [place] glands stimulated. Pt denies indention in skin from side view. Pt is breastfeeding and notices it more when breast is empty. No pain/masses. Has had mastitis 3 times this year with BF. FH breast cancer in her mat aunt in her 51s and pat grt aunt.  Hx of mammo with biopsy for RT breast calcifications with neg path 2018. Hx of fibrocystic breasts.    Past Medical History:  Diagnosis Date  . Alpha-1-antitrypsin deficiency ([place])   . Anxiety   . GERD (gastroesophageal reflux disease)   . Hemorrhoids   . Mitral valve prolapse     Past Surgical History:  Procedure Laterality Date  . ESOPHAGOGASTRODUODENOSCOPY ENDOSCOPY      Family History  Problem Relation Age of Onset  . Hypertension Father   . Breast cancer Other   . Breast cancer Maternal Aunt 52       stage 0  . Prostate cancer Maternal Uncle     Social History   Socioeconomic History  . Marital status: Married    Spouse name: Not on file  . Number of children: Not on file  . Years of education: Not on file  . Highest education level: Not on file  Occupational History  . Not on file  Social Needs  . Financial resource strain: Not hard at all  . Food insecurity    Worry: Never true    Inability: Never true  . Transportation needs    Medical: No    Non-medical: No  Tobacco Use  . Smoking status: Never Smoker  . Smokeless tobacco: Never Used  Substance and Sexual Activity  . Alcohol use: No  . Drug use: No  . Sexual activity: Yes    Birth  control/protection: None, Surgical    Comment: vasectomy  Lifestyle  . Physical activity    Days per week: 3 days    Minutes per session: 30 min  . Stress: To some extent  Relationships  . Social Herbalist on phone: Twice a week    Gets together: Once a week    Attends religious service: 1 to 4 times per year    Active member of club or organization: No    Attends meetings of clubs or organizations: Never    Relationship status: Married  . Intimate partner violence    Fear of current or ex partner: No    Emotionally abused: No    Physically abused: No    Forced sexual activity: No  Other Topics Concern  . Not on file  Social History Narrative  . Not on file    Outpatient Medications Prior to Visit  Medication Sig Dispense Refill  . B Complex Vitamins (VITAMIN B COMPLEX PO) Take by mouth.    . Cholecalciferol (VITAMIN D3) 1000 units CAPS Take 1 capsule by mouth daily.    Marland Kitchen lidocaine (XYLOCAINE) 5 % ointment Apply 1 application topically 3 (three) times daily as needed. 35.44 g 6  . omega-3  acid ethyl esters (LOVAZA) 1 g capsule Take by mouth.    . Prenatal Ca Carb-B6-B12-FA (BP FOLINATAL PLUS B) 1 MG TABS Take by mouth.     No facility-administered medications prior to visit.       ROS:  Review of Systems  Constitutional: Negative for fatigue, fever and unexpected weight change.  Respiratory: Negative for cough, shortness of breath and wheezing.   Cardiovascular: Negative for chest pain, palpitations and leg swelling.  Gastrointestinal: Negative for blood in stool, constipation, diarrhea, nausea and vomiting.  Endocrine: Negative for cold intolerance, heat intolerance and polyuria.  Genitourinary: Negative for dyspareunia, dysuria, flank pain, frequency, genital sores, hematuria, menstrual problem, pelvic pain, urgency, vaginal bleeding, vaginal discharge and vaginal pain.  Musculoskeletal: Negative for back pain, joint swelling and myalgias.  Skin: Negative  for rash.  Neurological: Negative for dizziness, syncope, light-headedness, numbness and headaches.  Hematological: Negative for adenopathy.  Psychiatric/Behavioral: Negative for agitation, confusion, sleep disturbance and suicidal ideas. The patient is not nervous/anxious.    BREAST: discoloration   OBJECTIVE:   Vitals:  BP 100/80   Ht 5\' 6"  (1.676 m)   Wt 185 lb (83.9 kg)   LMP 09/29/2018 (Exact Date)   Breastfeeding Yes   BMI 29.86 kg/m   Physical Exam Vitals signs reviewed.  Neck:     Musculoskeletal: Normal range of motion.  Pulmonary:     Effort: Pulmonary effort is normal.  Chest:     Breasts: Breasts are symmetrical.        Right: Skin change present. No inverted nipple, mass, nipple discharge or tenderness.        Left: No inverted nipple, mass, nipple discharge, skin change or tenderness.    Musculoskeletal: Normal range of motion.  Skin:    General: Skin is warm and dry.  Neurological:     General: No focal deficit present.     Mental Status: She is alert and oriented to person, place, and time.     Cranial Nerves: No cranial nerve deficit.  Psychiatric:        Mood and Affect: Mood normal.        Behavior: Behavior normal.        Thought Content: Thought content normal.        Judgment: Judgment normal.     Assessment/Plan: Breast skin changes - Plan: 10/01/2018 BREAST LTD UNI RIGHT INC AXILLA; RT breast 11:00 position of areola. Looks like skin discoloration since no skin retraction of breast. Suggested checking breast u/s since breastfeeding. Will f/u with results.     Return if symptoms worsen or fail to improve.  Taronda Comacho B. Kasmira Cacioppo, PA-C 10/12/2018 11:57 AM

## 2018-10-12 NOTE — Addendum Note (Signed)
Addended by: Ardeth Perfect B on: 5/95/3967 11:57 AM   Modules accepted: Orders

## 2018-10-12 NOTE — Addendum Note (Signed)
Addended by: Ardeth Perfect B on: 7/94/3276 02:54 PM   Modules accepted: Orders

## 2018-10-13 ENCOUNTER — Telehealth: Payer: Self-pay | Admitting: Obstetrics and Gynecology

## 2018-10-13 ENCOUNTER — Other Ambulatory Visit: Payer: Self-pay | Admitting: Obstetrics and Gynecology

## 2018-10-13 ENCOUNTER — Encounter: Payer: Self-pay | Admitting: Obstetrics and Gynecology

## 2018-10-13 NOTE — Telephone Encounter (Signed)
Patient aware of appt with [facility] on 11/02/2018 @3 :00pm

## 2018-10-15 ENCOUNTER — Other Ambulatory Visit: Payer: Self-pay | Admitting: Obstetrics and Gynecology

## 2018-10-15 DIAGNOSIS — R234 Changes in skin texture: Secondary | ICD-10-CM

## 2018-10-16 ENCOUNTER — Other Ambulatory Visit: Payer: BC Managed Care – PPO

## 2018-10-16 ENCOUNTER — Ambulatory Visit: Payer: BC Managed Care – PPO

## 2018-10-21 ENCOUNTER — Other Ambulatory Visit: Payer: Self-pay

## 2018-10-21 ENCOUNTER — Ambulatory Visit
Admission: RE | Admit: 2018-10-21 | Discharge: 2018-10-21 | Disposition: A | Discharge status: Home / Self Care | Payer: BC Managed Care – PPO | Source: Ambulatory Visit | Attending: Obstetrics and Gynecology | Admitting: Obstetrics and Gynecology

## 2018-10-21 DIAGNOSIS — N6489 Other specified disorders of breast: Secondary | ICD-10-CM | POA: Diagnosis not present

## 2018-10-21 DIAGNOSIS — R234 Changes in skin texture: Secondary | ICD-10-CM

## 2018-11-02 ENCOUNTER — Other Ambulatory Visit: Payer: BC Managed Care – PPO

## 2019-01-09 DIAGNOSIS — H6981 Other specified disorders of Eustachian tube, right ear: Secondary | ICD-10-CM | POA: Diagnosis not present

## 2019-01-11 DIAGNOSIS — Z20828 Contact with and (suspected) exposure to other viral communicable diseases: Secondary | ICD-10-CM | POA: Diagnosis not present

## 2019-01-12 ENCOUNTER — Ambulatory Visit
Admission: RE | Admit: 2019-01-12 | Discharge: 2019-01-12 | Disposition: A | Discharge status: Home / Self Care | Payer: BC Managed Care – PPO | Source: Ambulatory Visit | Attending: Obstetrics and Gynecology | Admitting: Obstetrics and Gynecology

## 2019-01-12 ENCOUNTER — Other Ambulatory Visit: Payer: Self-pay

## 2019-01-12 DIAGNOSIS — N6489 Other specified disorders of breast: Secondary | ICD-10-CM | POA: Diagnosis not present

## 2019-01-12 DIAGNOSIS — R234 Changes in skin texture: Secondary | ICD-10-CM

## 2019-01-12 DIAGNOSIS — Z1239 Encounter for other screening for malignant neoplasm of breast: Secondary | ICD-10-CM

## 2019-01-12 DIAGNOSIS — R928 Other abnormal and inconclusive findings on diagnostic imaging of breast: Secondary | ICD-10-CM | POA: Diagnosis not present

## 2019-01-13 ENCOUNTER — Encounter: Payer: Self-pay | Admitting: Obstetrics and Gynecology

## 2019-03-30 DIAGNOSIS — T781XXD Other adverse food reactions, not elsewhere classified, subsequent encounter: Secondary | ICD-10-CM | POA: Diagnosis not present

## 2019-03-30 DIAGNOSIS — J309 Allergic rhinitis, unspecified: Secondary | ICD-10-CM | POA: Diagnosis not present

## 2019-03-30 DIAGNOSIS — T781XXA Other adverse food reactions, not elsewhere classified, initial encounter: Secondary | ICD-10-CM | POA: Diagnosis not present

## 2019-04-19 DIAGNOSIS — Z20828 Contact with and (suspected) exposure to other viral communicable diseases: Secondary | ICD-10-CM | POA: Diagnosis not present

## 2019-05-31 DIAGNOSIS — Z20828 Contact with and (suspected) exposure to other viral communicable diseases: Secondary | ICD-10-CM | POA: Diagnosis not present

## 2019-05-31 DIAGNOSIS — Z03818 Encounter for observation for suspected exposure to other biological agents ruled out: Secondary | ICD-10-CM | POA: Diagnosis not present

## 2019-06-29 ENCOUNTER — Ambulatory Visit: Payer: BC Managed Care – PPO | Admitting: Obstetrics and Gynecology

## 2019-06-30 ENCOUNTER — Ambulatory Visit: Payer: BC Managed Care – PPO | Admitting: Obstetrics and Gynecology

## 2019-07-12 DIAGNOSIS — H04123 Dry eye syndrome of bilateral lacrimal glands: Secondary | ICD-10-CM | POA: Diagnosis not present

## 2019-07-22 ENCOUNTER — Encounter: Payer: Self-pay | Admitting: Obstetrics and Gynecology

## 2019-07-22 ENCOUNTER — Ambulatory Visit (INDEPENDENT_AMBULATORY_CARE_PROVIDER_SITE_OTHER): Payer: BC Managed Care – PPO | Admitting: Obstetrics and Gynecology

## 2019-07-22 ENCOUNTER — Other Ambulatory Visit: Payer: Self-pay

## 2019-07-22 VITALS — BP 128/72 | HR 86 | Resp 16 | Ht 66.0 in | Wt 163.2 lb

## 2019-07-22 DIAGNOSIS — Z1329 Encounter for screening for other suspected endocrine disorder: Secondary | ICD-10-CM

## 2019-07-22 DIAGNOSIS — Z01419 Encounter for gynecological examination (general) (routine) without abnormal findings: Secondary | ICD-10-CM

## 2019-07-22 DIAGNOSIS — Z1239 Encounter for other screening for malignant neoplasm of breast: Secondary | ICD-10-CM | POA: Diagnosis not present

## 2019-07-22 DIAGNOSIS — Z0000 Encounter for general adult medical examination without abnormal findings: Secondary | ICD-10-CM

## 2019-07-22 NOTE — Progress Notes (Signed)
Gynecology Annual Exam   PCP: Corky Downs, MD  Chief Complaint:  Chief Complaint  Patient presents with  . Gynecologic Exam    Annual Exam    History of Present Illness: Patient is a 34 y.o. [id number] presents for annual exam. The patient has no complaints today.   LMP: Patient's last menstrual period was 07/08/2019. Average Interval: regular, 28 days Duration of flow: 6 days Heavy Menses: heavier than before kids Clots: no Intermenstrual Bleeding: no Postcoital Bleeding: no Dysmenorrhea: no  The patient is sexually active. She currently uses vasectomy for contraception. She has some dyspareunia, improved with lidocaine.  The patient does perform self breast exams.  There is notable family history of breast or ovarian cancer in her family.  The patient has regular exercise: yes.    The patient denies current symptoms of depression.    Patient reports that she has continued to have some tenderness in her right breast at the site where she previously had an ultrasound.  Ultrasound showed normal breast tissue.  The spot has not grown per patient.  No follow-up was recommended per radiology.  She reports that she has been using lidocaine for her area of labial/hymenal irritation which is on her right side.  She reports that the lidocaine helps and makes intercourse more comfortable.  She occasionally will still have the pain.  She has concerns regarding a firm area which was noted on her rectal exam last year in which she would like me to reexamine that this year.  She does still have hemorrhoids which she manages conservatively.  She reports that she has noticed since she stopped breast-feeding in February 2021 that she has had thinning of her hair.  She noticed for 3 months that around the time of ovulation she had an increase in constipation but the symptoms have now resolved.  She notes some problems with gas pain during her menstrual cycle.  She also notes small lymph nodes  that seem to be swollen in her legs and thighs during her menstrual cycle.  She reports that she does have some increased heaviness of her menstrual cycles since her delivery.  Her family has a history of fibroids and she wonders if she might have or develop fibroids as well.  Review of Systems: Review of Systems  Constitutional: Negative for chills, fever, malaise/fatigue and weight loss.  HENT: Negative for congestion, hearing loss and sinus pain.   Eyes: Negative for blurred vision and double vision.  Respiratory: Negative for cough, sputum production, shortness of breath and wheezing.   Cardiovascular: Negative for chest pain, palpitations, orthopnea and leg swelling.  Gastrointestinal: Negative for abdominal pain, constipation, diarrhea, nausea and vomiting.  Genitourinary: Negative for dysuria, flank pain, frequency, hematuria and urgency.  Musculoskeletal: Negative for back pain, falls and joint pain.  Skin: Negative for itching and rash.  Neurological: Negative for dizziness and headaches.  Psychiatric/Behavioral: Negative for depression, substance abuse and suicidal ideas. The patient is not nervous/anxious.     Past Medical History:  Patient Active Problem List   Diagnosis Date Noted  . Postpartum care following vaginal delivery 10/21/2017  . Labor and delivery, indication for care 10/19/2017  . Post-dates pregnancy 10/19/2017  . High risk pregnancy with low PAPPA 09/17/2017  . Pregnancy 09/01/2017  . Supervision of other normal pregnancy, antepartum 02/24/2017    Clinic [facility] Prenatal Labs  Dating LMP = 7 week [place] Blood type: A positive  Genetic Screen 1 Screen: negative   AFP:  negative    Antibody:negative  Anatomic [place] Normal Female Rubella: Immune Varicella: Immune  GTT  normal RPR: NR  Rhogam N/A HBsAg: negative  TDaP vaccine DECLINED                       Flu Shot: declines HIV: negative  Baby Food  Breast                              GBS:   Contraception  Pap: NIL  HPV negative 03/29/2016  CBB   Pelvis tested to 3540g 7lbs 13oz  CS/VBAC    Support Person         . Galactorrhea 12/30/2016  . Cyst of right ovary 12/30/2016  . Mass of upper inner quadrant of left breast 12/30/2016  . RLQ abdominal pain 09/19/2016  . Suprapubic pain 09/19/2016  . Mitral valve prolapse     Past Surgical History:  Past Surgical History:  Procedure Laterality Date  . ESOPHAGOGASTRODUODENOSCOPY ENDOSCOPY      Gynecologic History:  Patient's last menstrual period was 07/08/2019. Contraception: vasectomy Last Pap: Results were: NIL and HR HPV negative   Obstetric History: [id number]  Family History:  Family History  Problem Relation Age of Onset  . Hypertension Father   . Breast cancer Other   . Breast cancer Maternal Aunt 52       stage 0  . Prostate cancer Maternal Uncle     Social History:  Social History   Socioeconomic History  . Marital status: Married    Spouse name: Not on file  . Number of children: Not on file  . Years of education: Not on file  . Highest education level: Not on file  Occupational History  . Not on file  Tobacco Use  . Smoking status: Never Smoker  . Smokeless tobacco: Never Used  Vaping Use  . Vaping Use: Never used  Substance and Sexual Activity  . Alcohol use: No  . Drug use: No  . Sexual activity: Yes    Birth control/protection: None, Surgical    Comment: vasectomy  Other Topics Concern  . Not on file  Social History Narrative  . Not on file   Social Determinants of Health   Financial Resource Strain:   . Difficulty of Paying Living Expenses:   Food Insecurity:   . Worried About Programme researcher, broadcasting/film/video in the Last Year:   . Barista in the Last Year:   Transportation Needs:   . Freight forwarder (Medical):   Marland Kitchen Lack of Transportation (Non-Medical):   Physical Activity:   . Days of Exercise per Week:   . Minutes of Exercise per Session:   Stress:   . Feeling of Stress :   Social  Connections:   . Frequency of Communication with Friends and Family:   . Frequency of Social Gatherings with Friends and Family:   . Attends Religious Services:   . Active Member of Clubs or Organizations:   . Attends Banker Meetings:   Marland Kitchen Marital Status:   Intimate Partner Violence:   . Fear of Current or Ex-Partner:   . Emotionally Abused:   Marland Kitchen Physically Abused:   . Sexually Abused:     Allergies:  Allergies  Allergen Reactions  . Brompheniramine-Pseudoeph Hives  . Clarithromycin Hives  . Robitussin (Alcohol Free) [Guaifenesin] Hives  . Sulfa Antibiotics Hives    Medications: Prior  to Admission medications   Medication Sig Start Date End Date Taking? Authorizing Provider  B Complex Vitamins (VITAMIN B COMPLEX PO) Take by mouth.   Yes [provider]  Cholecalciferol (VITAMIN D3) 1000 units CAPS Take 1 capsule by mouth daily.   Yes [provider]  lidocaine (XYLOCAINE) 5 % ointment Apply 1 application topically 3 (three) times daily as needed. 07/09/18  Yes Javionna Leder R, MD  Prenatal Ca Carb-B6-B12-FA (BP FOLINATAL PLUS B) 1 MG TABS Take by mouth.   Yes [provider]    Physical Exam Vitals: Blood pressure 128/72, pulse 86, resp. rate 16, height 5\' 6"  (1.676 m), weight 163 lb 3.2 oz (74 kg), last menstrual period 07/08/2019, SpO2 99 %, currently breastfeeding.  General: NAD HEENT: normocephalic, anicteric Thyroid: no enlargement, no palpable nodules Pulmonary: No increased work of breathing, CTAB Cardiovascular: RRR, distal pulses 2+ Breast: Breast symmetrical, no tenderness, no palpable nodules or masses, no skin or nipple retraction present, no nipple discharge.  No axillary or supraclavicular lymphadenopathy. Abdomen: NABS, soft, non-tender, non-distended.  Umbilicus without lesions.  No hepatomegaly, splenomegaly or masses palpable. No evidence of hernia  Genitourinary:  External: Normal external female genitalia.   Normal urethral meatus, normal Bartholin's and Skene's glands.  Area of concern appears to be sight of vaginal/labial laceration.  Vagina: Normal vaginal mucosa, no evidence of prolapse.    Cervix: Grossly normal in appearance, no bleeding  Uterus: Non-enlarged, mobile, normal contour.  No CMT  Adnexa: ovaries non-enlarged, no adnexal masses  Rectal: normal, external hemorrhoids present.   Lymphatic: no evidence of inguinal lymphadenopathy Extremities: no edema, erythema, or tenderness Neurologic: Grossly intact Psychiatric: mood appropriate, affect full  Female chaperone present for pelvic and breast  portions of the physical exam   Assessment: 34 y.o. G2P2002 routine annual exam  Plan: Problem List Items Addressed This Visit    None    Visit Diagnoses    Thyroid disorder screen    -  Primary   Relevant Orders   TSH + free T4   T3, free   Encounter for annual routine gynecological examination       Health maintenance examination       Encounter for screening breast examination       Encounter for gynecological examination without abnormal finding          2) STI screening  was offered and declined  2)  ASCCP guidelines and rational discussed.  Patient opts for every 5 years screening interval  3) Contraception - the patient is currently using  vasectomy.  She is happy with her current form of contraception and plans to continue  4) Routine healthcare maintenance including cholesterol, diabetes screening discussed managed by PCP  5) Will check thyroid labs given symptoms of hair thinning and constipation.   6)  Return in about 1 year (around 07/21/2020) for annual.  09/21/2020 MD, Adelene Idler [facility], [facility] 07/22/2019 3:27 PM

## 2019-07-22 NOTE — Patient Instructions (Signed)
Institute of [employer] Recommended Dietary Allowances for Calcium and Vitamin D  Age (yr) Calcium Recommended Dietary Allowance (mg/day) Vitamin D Recommended Dietary Allowance (international units/day)  9-18 1,300 600  19-50 1,000 600  51-70 1,200 600  71 and older 1,200 800  Data from [employer]. Dietary reference intakes: calcium, vitamin D. [place], [place]: National Academies Press; 2011.    Budget-Friendly Healthy Eating There are many ways to save money at the grocery store and continue to eat healthy. You can be successful if you:  Plan meals according to your budget.  Make a grocery list and only purchase food according to your grocery list.  Prepare food yourself. What are tips for following this plan?  Reading food labels  Compare food labels between brand name foods and the store brand. Often the nutritional value is the same, but the store brand is lower cost.  Look for products that do not have added sugar, fat, or salt (sodium). These often cost the same but are healthier for you. Products may be labeled as: ? Sugar-free. ? Nonfat. ? Low-fat. ? Sodium-free. ? Low-sodium.  Look for lean ground beef labeled as at least 92% lean and 8% fat. Shopping  Buy only the items on your grocery list and go only to the areas of the store that have the items on your list.  Use coupons only for foods and brands you normally buy. Avoid buying items you wouldn't normally buy simply because they are on sale.  Check online and in newspapers for weekly deals.  Buy healthy items from the bulk bins when available, such as herbs, spices, flour, pasta, nuts, and dried fruit.  Buy fruits and vegetables that are in season. Prices are usually lower on in-season produce.  Look at the unit price on the price tag. Use it to compare different brands and sizes to find out which item is the best deal.  Choose healthy items that are often low-cost, such as carrots,  potatoes, apples, bananas, and oranges. Dried or canned beans are a low-cost protein source.  Buy in bulk and freeze extra food. Items you can buy in bulk include meats, fish, poultry, frozen fruits, and frozen vegetables.  Avoid buying "ready-to-eat" foods, such as pre-cut fruits and vegetables and pre-made salads.  If possible, shop around to discover where you can find the best prices. Consider other retailers such as dollar stores, larger wholesale stores, local fruit and vegetable stands, and farmers markets.  Do not shop when you are hungry. If you shop while hungry, it may be hard to stick to your list and budget.  Resist impulse buying. Use your grocery list as your official plan for the week.  Buy a variety of vegetables and fruits by purchasing fresh, frozen, and canned items.  Look at the top and bottom shelves for deals. Foods at eye level (eye level of an adult or child) are usually more expensive.  Be efficient with your time when shopping. The more time you spend at the store, the more money you are likely to spend.  To save money when choosing more expensive foods like meats and dairy: ? Choose cheaper cuts of meat, such as bone-in chicken thighs and drumsticks instead of skinless and boneless chicken. When you are ready to prepare the chicken, you can remove the skin yourself to make it healthier. ? Choose lean meats like chicken or turkey instead of beef. ? Choose canned seafood, such as tuna, salmon, or sardines. ? Buy eggs   as a low-cost source of protein. ? Buy dried beans and peas, such as lentils, split peas, or kidney beans instead of meats. Dried beans and peas are a good alternative source of protein. ? Buy the larger tubs of yogurt instead of individual-sized containers.  Choose water instead of sodas and other sweetened beverages.  Avoid buying chips, cookies, and other "junk food." These items are usually expensive and not healthy. Cooking  Make extra food  and freeze the extras in meal-sized containers or in individual portions for fast meals and snacks.  Pre-cook on days when you have extra time to prepare meals in advance. You can keep these meals in the fridge or freezer and reheat for a quick meal.  When you come home from the grocery store, wash, peel, and cut fruits and vegetables so they are ready to use and eat. This will help reduce food waste. Meal planning  Do not eat out or get fast food. Prepare food at home.  Make a grocery list and make sure to bring it with you to the store. If you have a smart phone, you could use your phone to create your shopping list.  Plan meals and snacks according to a grocery list and budget you create.  Use leftovers in your meal plan for the week.  Look for recipes where you can cook once and make enough food for two meals.  Include budget-friendly meals like stews, casseroles, and stir-fry dishes.  Try some meatless meals or try "no cook" meals like salads.  Make sure that half your plate is filled with fruits or vegetables. Choose from fresh, frozen, or canned fruits and vegetables. If eating canned, remember to rinse them before eating. This will remove any excess salt added for packaging. Summary  Eating healthy on a budget is possible if you plan your meals according to your budget, purchase according to your budget and grocery list, and prepare food yourself.  Tips for buying more food on a limited budget include buying generic brands, using coupons only for foods you normally buy, and buying healthy items from the bulk bins when available.  Tips for buying cheaper food to replace expensive food include choosing cheaper, lean cuts of meat, and buying dried beans and peas. This information is not intended to replace advice given to you by your health care provider. Make sure you discuss any questions you have with your health care provider. Document Revised: 01/01/2017 Document Reviewed:  01/01/2017 Elsevier Patient Education  2020 Else[employer] Inc.   Exercising to Stay Healthy To become healthy and stay healthy, it is recommended that you do moderate-intensity and vigorous-intensity exercise. You can tell that you are exercising at a moderate intensity if your heart starts beating faster and you start breathing faster but can still hold a conversation. You can tell that you are exercising at a vigorous intensity if you are breathing much harder and faster and cannot hold a conversation while exercising. Exercising regularly is important. It has many health benefits, such as:  Improving overall fitness, flexibility, and endurance.  Increasing bone density.  Helping with weight control.  Decreasing body fat.  Increasing muscle strength.  Reducing stress and tension.  Improving overall health. How often should I exercise? Choose an activity that you enjoy, and set realistic goals. Your health care provider can help you make an activity plan that works for you. Exercise regularly as told by your health care provider. This may include:  Doing strength training two times a   week, such as: ? Lifting weights. ? Using resistance bands. ? Push-ups. ? Sit-ups. ? Yoga.  Doing a certain intensity of exercise for a given amount of time. Choose from these options: ? A total of 150 minutes of moderate-intensity exercise every week. ? A total of 75 minutes of vigorous-intensity exercise every week. ? A mix of moderate-intensity and vigorous-intensity exercise every week. Children, pregnant women, people who have not exercised regularly, people who are overweight, and older adults may need to talk with a health care provider about what activities are safe to do. If you have a medical condition, be sure to talk with your health care provider before you start a new exercise program. What are some exercise ideas? Moderate-intensity exercise ideas include:  Walking 1 mile (1.6 km) in  about 15 minutes.  Biking.  Hiking.  Golfing.  Dancing.  Water aerobics. Vigorous-intensity exercise ideas include:  Walking 4.5 miles (7.2 km) or more in about 1 hour.  Jogging or running 5 miles (8 km) in about 1 hour.  Biking 10 miles (16.1 km) or more in about 1 hour.  Lap swimming.  Roller-skating or in-line skating.  Cross-country skiing.  Vigorous competitive sports, such as football, basketball, and soccer.  Jumping rope.  Aerobic dancing. What are some everyday activities that can help me to get exercise?  Yard work, such as: ? Pushing a lawn mower. ? Raking and bagging leaves.  Washing your car.  Pushing a stroller.  Shoveling snow.  Gardening.  Washing windows or floors. How can I be more active in my day-to-day activities?  Use stairs instead of an elevator.  Take a walk during your lunch break.  If you drive, park your car farther away from your work or school.  If you take public transportation, get off one stop early and walk the rest of the way.  Stand up or walk around during all of your indoor phone calls.  Get up, stretch, and walk around every 30 minutes throughout the day.  Enjoy exercise with a friend. Support to continue exercising will help you keep a regular routine of activity. What guidelines can I follow while exercising?  Before you start a new exercise program, talk with your health care provider.  Do not exercise so much that you hurt yourself, feel dizzy, or get very short of breath.  Wear comfortable clothes and wear shoes with good support.  Drink plenty of water while you exercise to prevent dehydration or heat stroke.  Work out until your breathing and your heartbeat get faster. Where to find more information  [place] Department of Health and Human Services: www.hhs.gov  Centers for Disease Control and Prevention (CDC): www.cdc.gov Summary  Exercising regularly is important. It will improve your overall  fitness, flexibility, and endurance.  Regular exercise also will improve your overall health. It can help you control your weight, reduce stress, and improve your bone density.  Do not exercise so much that you hurt yourself, feel dizzy, or get very short of breath.  Before you start a new exercise program, talk with your health care provider. This information is not intended to replace advice given to you by your health care provider. Make sure you discuss any questions you have with your health care provider. Document Revised: 12/13/2016 Document Reviewed: 11/21/2016 Elsevier Patient Education  2020 Elsevier Inc.   Bone Health Bones protect organs, store calcium, anchor muscles, and support the whole body. Keeping your bones strong is important, especially as you get   older. You can take actions to help keep your bones strong and healthy. Why is keeping my bones healthy important?  Keeping your bones healthy is important because your body constantly replaces bone cells. Cells get old, and new cells take their place. As we age, we lose bone cells because the body may not be able to make enough new cells to replace the old cells. The amount of bone cells and bone tissue you have is referred to as bone mass. The higher your bone mass, the stronger your bones. The aging process leads to an overall loss of bone mass in the body, which can increase the likelihood of:  Joint pain and stiffness.  Broken bones.  A condition in which the bones become weak and brittle (osteoporosis). A large decline in bone mass occurs in older adults. In women, it occurs about the time of menopause. What actions can I take to keep my bones healthy? Good health habits are important for maintaining healthy bones. This includes eating nutritious foods and exercising regularly. To have healthy bones, you need to get enough of the right minerals and vitamins. Most nutrition experts recommend getting these nutrients from  the foods that you eat. In some cases, taking supplements may also be recommended. Doing certain types of exercise is also important for bone health. What are the nutritional recommendations for healthy bones?  Eating a well-balanced diet with plenty of calcium and vitamin D will help to protect your bones. Nutritional recommendations vary from person to person. Ask your health care provider what is healthy for you. Here are some general guidelines. Get enough calcium Calcium is the most important (essential) mineral for bone health. Most people can get enough calcium from their diet, but supplements may be recommended for people who are at risk for osteoporosis. Good sources of calcium include:  Dairy products, such as low-fat or nonfat milk, cheese, and yogurt.  Dark green leafy vegetables, such as bok choy and broccoli.  Calcium-fortified foods, such as orange juice, cereal, bread, soy beverages, and tofu products.  Nuts, such as almonds. Follow these recommended amounts for daily calcium intake:  Children, age 1-3: 700 mg.  Children, age 4-8: 1,000 mg.  Children, age 9-13: 1,300 mg.  Teens, age 14-18: 1,300 mg.  Adults, age 19-50: 1,000 mg.  Adults, age 51-70: ? Men: 1,000 mg. ? Women: 1,200 mg.  Adults, age 71 or older: 1,200 mg.  Pregnant and breastfeeding females: ? Teens: 1,300 mg. ? Adults: 1,000 mg. Get enough vitamin D Vitamin D is the most essential vitamin for bone health. It helps the body absorb calcium. Sunlight stimulates the skin to make vitamin D, so be sure to get enough sunlight. If you live in a cold climate or you do not get outside often, your health care provider may recommend that you take vitamin D supplements. Good sources of vitamin D in your diet include:  Egg yolks.  Saltwater fish.  Milk and cereal fortified with vitamin D. Follow these recommended amounts for daily vitamin D intake:  Children and teens, age 1-18: 600 international  units.  Adults, age 50 or younger: 400-800 international units.  Adults, age 51 or older: 800-1,000 international units. Get other important nutrients Other nutrients that are important for bone health include:  Phosphorus. This mineral is found in meat, poultry, dairy foods, nuts, and legumes. The recommended daily intake for adult men and adult women is 700 mg.  Magnesium. This mineral is found in seeds, nuts, dark green   vegetables, and legumes. The recommended daily intake for adult men is 400-420 mg. For adult women, it is 310-320 mg.  Vitamin K. This vitamin is found in green leafy vegetables. The recommended daily intake is 120 mg for adult men and 90 mg for adult women. What type of physical activity is best for building and maintaining healthy bones? Weight-bearing and strength-building activities are important for building and maintaining healthy bones. Weight-bearing activities cause muscles and bones to work against gravity. Strength-building activities increase the strength of the muscles that support bones. Weight-bearing and muscle-building activities include:  Walking and hiking.  Jogging and running.  Dancing.  Gym exercises.  Lifting weights.  Tennis and racquetball.  Climbing stairs.  Aerobics. Adults should get at least 30 minutes of moderate physical activity on most days. Children should get at least 60 minutes of moderate physical activity on most days. Ask your health care provider what type of exercise is best for you. How can I find out if my bone mass is low? Bone mass can be measured with an X-ray test called a bone mineral density (BMD) test. This test is recommended for all women who are age 65 or older. It may also be recommended for:  Men who are age 70 or older.  People who are at risk for osteoporosis because of: ? Having bones that break easily. ? Having a long-term disease that weakens bones, such as kidney disease or rheumatoid  arthritis. ? Having menopause earlier than normal. ? Taking medicine that weakens bones, such as steroids, thyroid hormones, or hormone treatment for breast cancer or prostate cancer. ? Smoking. ? Drinking three or more alcoholic drinks a day. If you find that you have a low bone mass, you may be able to prevent osteoporosis or further bone loss by changing your diet and lifestyle. Where can I find more information? For more information, check out the following websites:  National Osteoporosis Foundation: www.nof.org/patients  National Institutes of Health: www.bones.nih.gov  International Osteoporosis Foundation: www.iofbonehealth.org Summary  The aging process leads to an overall loss of bone mass in the body, which can increase the likelihood of broken bones and osteoporosis.  Eating a well-balanced diet with plenty of calcium and vitamin D will help to protect your bones.  Weight-bearing and strength-building activities are also important for building and maintaining strong bones.  Bone mass can be measured with an X-ray test called a bone mineral density (BMD) test. This information is not intended to replace advice given to you by your health care provider. Make sure you discuss any questions you have with your health care provider. Document Revised: 01/27/2017 Document Reviewed: 01/27/2017 Elsevier Patient Education  2020 Elsevier Inc.   

## 2019-07-23 DIAGNOSIS — Z86018 Personal history of other benign neoplasm: Secondary | ICD-10-CM | POA: Diagnosis not present

## 2019-07-23 DIAGNOSIS — L720 Epidermal cyst: Secondary | ICD-10-CM | POA: Diagnosis not present

## 2019-07-23 DIAGNOSIS — D2330 Other benign neoplasm of skin of unspecified part of face: Secondary | ICD-10-CM | POA: Diagnosis not present

## 2019-07-23 DIAGNOSIS — L578 Other skin changes due to chronic exposure to nonionizing radiation: Secondary | ICD-10-CM | POA: Diagnosis not present

## 2019-07-23 LAB — TSH+FREE T4
Free T4: 1.22 ng/dL (ref 0.82–1.77)
TSH: 0.387 u[IU]/mL — ABNORMAL LOW (ref 0.450–4.500)

## 2019-07-23 LAB — T3, FREE: T3, Free: 3.5 pg/mL (ref 2.0–4.4)

## 2019-07-28 ENCOUNTER — Other Ambulatory Visit: Payer: Self-pay | Admitting: Obstetrics and Gynecology

## 2019-07-28 DIAGNOSIS — Z1329 Encounter for screening for other suspected endocrine disorder: Secondary | ICD-10-CM

## 2019-08-20 DIAGNOSIS — Z20822 Contact with and (suspected) exposure to covid-19: Secondary | ICD-10-CM | POA: Diagnosis not present

## 2019-09-16 ENCOUNTER — Encounter: Payer: Self-pay | Admitting: Internal Medicine

## 2019-09-16 ENCOUNTER — Ambulatory Visit: Payer: BC Managed Care – PPO | Admitting: Internal Medicine

## 2019-09-16 ENCOUNTER — Other Ambulatory Visit: Payer: Self-pay

## 2019-09-16 VITALS — BP 134/86 | HR 115 | Ht 67.0 in | Wt 157.0 lb

## 2019-09-16 DIAGNOSIS — H9313 Tinnitus, bilateral: Secondary | ICD-10-CM

## 2019-09-16 DIAGNOSIS — I341 Nonrheumatic mitral (valve) prolapse: Secondary | ICD-10-CM | POA: Diagnosis not present

## 2019-09-16 DIAGNOSIS — H8112 Benign paroxysmal vertigo, left ear: Secondary | ICD-10-CM | POA: Insufficient documentation

## 2019-09-16 DIAGNOSIS — H65113 Acute and subacute allergic otitis media (mucoid) (sanguinous) (serous), bilateral: Secondary | ICD-10-CM | POA: Diagnosis not present

## 2019-09-16 NOTE — Patient Instructions (Addendum)
You can make a salt water rinse at home by mixing 1 cup (8 ounces) of water, 1 teaspoon of salt, and 1 teaspoon of baking soda. Gargle with this at least three times per day.  -----------------------------------------   Vertigo Vertigo is the feeling that you or the things around you are moving when they are not. This feeling can come and go at any time. Vertigo often goes away on its own. This condition can be dangerous if it happens when you are doing activities like driving or working with machines. Your doctor will do tests to find the cause of your vertigo. These tests will also help your doctor decide on the best treatment for you. Follow these instructions at home: Eating and drinking  Drink enough fluid to keep your pee (urine) pale yellow.  Do not drink alcohol. Activity  Return to your normal activities as told by your doctor. Ask your doctor what activities are safe for you.  In the morning, first sit up on the side of the bed. When you feel okay, stand slowly while you hold onto something until you know that your balance is fine.  Move slowly. Avoid sudden body or head movements or certain positions, as told by your doctor.  Use a cane if you have trouble standing or walking.  Sit down right away if you feel dizzy.  Avoid doing any tasks or activities that can cause danger to you or others if you get dizzy.  Avoid bending down if you feel dizzy. Place items in your home so that they are easy for you to reach without leaning over.  Do not drive or use heavy machinery if you feel dizzy. General instructions  Take over-the-counter and prescription medicines only as told by your doctor.  Keep all follow-up visits as told by your doctor. This is important. Contact a doctor if:  Your medicine does not help your vertigo.  You have a fever.  Your problems get worse or you have new symptoms.  Your family or friends see changes in your behavior.  The feeling of being  sick to your stomach gets worse.  Your vomiting gets worse.  You lose feeling (have numbness) in part of your body.  You feel prickling and tingling in a part of your body. Get help right away if:  You have trouble moving or talking.  You are always dizzy.  You pass out (faint).  You get very bad headaches.  You feel weak in your hands, arms, or legs.  You have changes in your hearing.  You have changes in how you see (vision).  You get a stiff neck.  Bright light starts to bother you. Summary  Vertigo is the feeling that you or the things around you are moving when they are not.  Your doctor will do tests to find the cause of your vertigo.  You may be told to avoid some tasks, positions, or movements.  Contact a doctor if your medicine is not helping, or if you have a fever, new symptoms, or a change in behavior.  Get help right away if you get very bad headaches, or if you have changes in how you speak, hear, or see. This information is not intended to replace advice given to you by your health care provider. Make sure you discuss any questions you have with your health care provider. Document Revised: 11/24/2017 Document Reviewed: 11/24/2017 Elsevier Patient Education  2020 [employer].

## 2019-09-16 NOTE — Assessment & Plan Note (Signed)
Pt left ear is retracted. Right ear has evidence of allergic serous fluid behind. I asked her to take Claritin 5mg  PO daily, use steroid nasal spray, and do saline gargles. I also encouraged her to drink plenty of fluid.

## 2019-09-16 NOTE — Assessment & Plan Note (Signed)
Pt has swishing sound in both ears secondary to movement in the neck, left worse than right. I didn't hear any bruit in bilateral carotid artery or in the back of the neck. She doesn't have any nystagmus. She is not vasovagal on blood pressure check. There is no evidence of posterior hypotension. If her symptoms persist we will refer her to ENT.

## 2019-09-16 NOTE — Assessment & Plan Note (Signed)
Will refer to ENT if her condition does not get better.

## 2019-09-16 NOTE — Assessment & Plan Note (Signed)
Pt has mitral valve prolapse. She does not have tachycardia. She was advised to drink extra fluid and walk daily.

## 2019-09-16 NOTE — Progress Notes (Signed)
Established Patient Office Visit  SUBJECTIVE:  Subjective  Patient ID: Gabrielle Mcdowell, female    DOB: Mar 17, 1985  Age: 34 y.o. MRN: [id number]  CC:  Chief Complaint  Patient presents with  . Alopecia    patient states that her hair seems to be thinning and she discussed with her GYN and had a thyroid panel done. Patient would like to discuss the results.   . fluid in ear    patient states that she hears a whooshing sound in her ears     HPI Gabrielle Mcdowell is a 34 y.o. female presenting today for   She notes that as she is falling asleep, she has been noticing a loud "whooshing" noise in both of her ears. She said as she tried to ignore it, it would get louder and higher pitched. She took some nasal spray. She started hearing the whooshing some during the day. She notes that she heard a loud whistling noise when she was checking her neck with a stethoscope. She has a history of a retracted ear drum on the left side. She does not some dizziness with extreme positional changes.   She also notes that she has noticed that her hair has been thinning. She was also having palpitations. Her OBGYN checked her thyroid. 07/22/2019 TSH was 0.387.    Past Medical History:  Diagnosis Date  . Alpha-1-antitrypsin deficiency (HCC)   . Anxiety   . GERD (gastroesophageal reflux disease)   . Hemorrhoids   . Mitral valve prolapse     Past Surgical History:  Procedure Laterality Date  . ESOPHAGOGASTRODUODENOSCOPY ENDOSCOPY      Family History  Problem Relation Age of Onset  . Hypertension Father   . Breast cancer Other   . Breast cancer Maternal Aunt 52       stage 0  . Prostate cancer Maternal Uncle     Social History   Socioeconomic History  . Marital status: Married    Spouse name: Not on file  . Number of children: Not on file  . Years of education: Not on file  . Highest education level: Not on file  Occupational History  . Not on file  Tobacco Use  . Smoking  status: Never Smoker  . Smokeless tobacco: Never Used  Vaping Use  . Vaping Use: Never used  Substance and Sexual Activity  . Alcohol use: No  . Drug use: No  . Sexual activity: Yes    Birth control/protection: None, Surgical    Comment: vasectomy  Other Topics Concern  . Not on file  Social History Narrative  . Not on file   Social Determinants of Health   Financial Resource Strain:   . Difficulty of Paying Living Expenses: Not on file  Food Insecurity:   . Worried About Programme researcher, broadcasting/film/video in the Last Year: Not on file  . Ran Out of Food in the Last Year: Not on file  Transportation Needs:   . Lack of Transportation (Medical): Not on file  . Lack of Transportation (Non-Medical): Not on file  Physical Activity:   . Days of Exercise per Week: Not on file  . Minutes of Exercise per Session: Not on file  Stress:   . Feeling of Stress : Not on file  Social Connections:   . Frequency of Communication with Friends and Family: Not on file  . Frequency of Social Gatherings with Friends and Family: Not on file  . Attends Religious Services: Not on  file  . Active Member of Clubs or Organizations: Not on file  . Attends Banker Meetings: Not on file  . Marital Status: Not on file  Intimate Partner Violence:   . Fear of Current or Ex-Partner: Not on file  . Emotionally Abused: Not on file  . Physically Abused: Not on file  . Sexually Abused: Not on file     Current Outpatient Medications:  .  B Complex Vitamins (VITAMIN B COMPLEX PO), Take by mouth., Disp: , Rfl:  .  Cholecalciferol (VITAMIN D3) 1000 units CAPS, Take 1 capsule by mouth daily., Disp: , Rfl:  .  Magnesium 100 MG CAPS, Take 100 mg by mouth., Disp: , Rfl:  .  Prenatal Ca Carb-B6-B12-FA (BP FOLINATAL PLUS B) 1 MG TABS, Take by mouth., Disp: , Rfl:  .  lidocaine (XYLOCAINE) 5 % ointment, Apply 1 application topically 3 (three) times daily as needed., Disp: 35.44 g, Rfl: 6   Allergies  Allergen  Reactions  . Brompheniramine-Pseudoeph Hives  . Clarithromycin Hives  . Robitussin (Alcohol Free) [Guaifenesin] Hives  . Sulfa Antibiotics Hives    ROS Review of Systems  Constitutional: Negative.   HENT: Positive for tinnitus ("whooshing" bilaterally).   Eyes: Negative.   Respiratory: Negative.   Cardiovascular: Positive for palpitations.  Gastrointestinal: Negative.   Endocrine: Negative.   Genitourinary: Negative.   Musculoskeletal: Negative.   Skin: Negative.   Allergic/Immunologic: Negative.   Neurological: Positive for light-headedness (positional).  Hematological: Negative.   Psychiatric/Behavioral: Negative.   All other systems reviewed and are negative.    OBJECTIVE:    Physical Exam Vitals reviewed.  Constitutional:      Appearance: Normal appearance.  HENT:     Right Ear: Tympanic membrane is erythematous.     Left Ear: Tympanic membrane is retracted.     Mouth/Throat:     Mouth: Mucous membranes are moist.  Eyes:     Pupils: Pupils are equal, round, and reactive to light.  Neck:     Vascular: No carotid bruit.  Cardiovascular:     Rate and Rhythm: Normal rate and regular rhythm.     Pulses: Normal pulses.     Heart sounds: Murmur heard.  Systolic murmur is present with a grade of 2/6.   Pulmonary:     Effort: Pulmonary effort is normal.     Breath sounds: Normal breath sounds.  Abdominal:     General: Bowel sounds are normal.     Palpations: Abdomen is soft. There is no hepatomegaly, splenomegaly or mass.     Tenderness: There is no abdominal tenderness.     Hernia: No hernia is present.  Musculoskeletal:        General: No tenderness.     Cervical back: Neck supple.     Right lower leg: No edema.     Left lower leg: No edema.  Skin:    Findings: No rash.  Neurological:     Mental Status: She is alert and oriented to person, place, and time.     Cranial Nerves: Cranial nerves are intact.     Sensory: Sensation is intact.     Motor: Motor  function is intact. No weakness.     Coordination: Coordination is intact.     Gait: Gait is intact.  Psychiatric:        Mood and Affect: Mood and affect normal.        Behavior: Behavior normal.     BP 134/86 (BP Location: Right  Arm, Patient Position: Standing)   Pulse (!) 115   Ht 5\' 7"  (1.702 m)   Wt 157 lb (71.2 kg)   BMI 24.59 kg/m  Wt Readings from Last 3 Encounters:  09/16/19 157 lb (71.2 kg)  07/22/19 163 lb 3.2 oz (74 kg)  10/07/18 185 lb (83.9 kg)    Health Maintenance Due  Topic Date Due  . COVID-19 Vaccine (1) Never done  . TETANUS/TDAP  Never done  . INFLUENZA VACCINE  Never done    There are no preventive care reminders to display for this patient.  CBC Latest Ref Rng & Units 06/29/2018 10/20/2017 10/19/2017  WBC 3.4 - 10.8 x10E3/uL 7.2 10.2 11.9(H)  Hemoglobin 11.1 - 15.9 g/dL 12/19/2017 11.1(L) 12.7  Hematocrit 34.0 - 46.6 % 41.3 32.5(L) 37.0  Platelets 150 - 450 x10E3/uL 303 178 188   CMP Latest Ref Rng & Units 09/26/2017 09/01/2017 06/03/2016  Glucose 70 - 99 mg/dL 06/05/2016) 761(Y) 073(X)  BUN 6 - 20 mg/dL 7 7 12   Creatinine 0.44 - 1.00 mg/dL 106(Y) ) 6.94(W  Sodium 135 - 145 mmol/L 137 138 138  Potassium 3.5 - 5.1 mmol/L 3.5 3.6 3.9  Chloride 98 - 111 mmol/L 109 106 107  CO2 22 - 32 mmol/L 19(L) 22 24  Calcium 8.9 - 10.3 mg/dL 8.9 9.2 5.46(E  Total Protein 6.5 - 8.1 g/dL 6.1(L) 6.1(L) 8.1  Total Bilirubin 0.3 - 1.2 mg/dL 0.4 0.4 0.6  Alkaline Phos 38 - 126 U/L 80 68 47  AST 15 - 41 U/L 23 21 22   ALT 0 - 44 U/L 14 14 14     Lab Results  Component Value Date   TSH 0.387 (L) 07/22/2019   Lab Results  Component Value Date   ALBUMIN 3.1 (L) 09/26/2017   ANIONGAP 9 09/26/2017   No results found for: CHOL, HDL, LDLCALC, CHOLHDL No results found for: TRIG No results found for: HGBA1C    ASSESSMENT & PLAN:   Problem List Items Addressed This Visit      Cardiovascular and Mediastinum   Mitral valve prolapse    Pt has mitral valve prolapse. She does  not have tachycardia. She was advised to drink extra fluid and walk daily.         Nervous and Auditory   Benign paroxysmal positional vertigo of left ear - Primary    Pt has swishing sound in both ears secondary to movement in the neck, left worse than right. I didn't hear any bruit in bilateral carotid artery or in the back of the neck. She doesn't have any nystagmus. She is not vasovagal on blood pressure check. There is no evidence of posterior hypotension. If her symptoms persist we will refer her to ENT.       Acute allergic mucoid otitis media of both ears    Pt left ear is retracted. Right ear has evidence of allergic serous fluid behind. I asked her to take Claritin 5mg  PO daily, use steroid nasal spray, and do saline gargles. I also encouraged her to drink plenty of fluid.         Other   Tinnitus of both ears    Will refer to ENT if her condition does not get better.          No orders of the defined types were placed in this encounter.   Follow-up: No follow-ups on file.    Dr. [facility] [address], [place], [place]  By signing my name below, I, [employer], attest that this documentation has been prepared under the direction and in the presence of Corky DownsMasoud, Zayne Marovich, MD. Electronically Signed: Corky DownsJaved Evalisse Prajapati, MD 09/16/19, 11:09 AM   I personally performed the services described in this documentation, which was SCRIBED in my presence. The recorded information has been reviewed and considered accurate. It has been edited as necessary during review. Corky DownsJaved Seung Nidiffer, MD

## 2019-09-22 ENCOUNTER — Other Ambulatory Visit: Payer: BC Managed Care – PPO

## 2019-09-23 ENCOUNTER — Other Ambulatory Visit: Payer: Self-pay | Admitting: Internal Medicine

## 2019-09-23 ENCOUNTER — Other Ambulatory Visit: Payer: Self-pay

## 2019-09-23 ENCOUNTER — Other Ambulatory Visit: Payer: BC Managed Care – PPO

## 2019-09-23 DIAGNOSIS — I341 Nonrheumatic mitral (valve) prolapse: Secondary | ICD-10-CM

## 2019-09-23 NOTE — Addendum Note (Signed)
Addended by: Jobie Quaker on: 09/23/2019 09:27 AM   Modules accepted: Orders

## 2019-10-07 DIAGNOSIS — Z20822 Contact with and (suspected) exposure to covid-19: Secondary | ICD-10-CM | POA: Diagnosis not present

## 2019-10-26 ENCOUNTER — Other Ambulatory Visit: Payer: BC Managed Care – PPO

## 2019-10-26 ENCOUNTER — Other Ambulatory Visit: Payer: Self-pay

## 2019-10-26 NOTE — Progress Notes (Signed)
PATIENT ID: Gabrielle Mcdowell, female     DOB: [birth year], 34 y.o.     MRN: [id number]      [facility] [address] [place], [address]  DATE OF SERVICE: 10/26/2019  CAROTID DOPPLER INTERPRETATION:  Bilateral Carotid Ultrsasound and Color Doppler Examination was performed. The RIGHT CCA shows no plaque in the vessel. The LEFT CCA shows no plaque in the vessel. There was no intimal thickening noted in the RIGHT carotid artery. There was no intimal thickening in the LEFT carotid artery.  Impression:    The RIGHT CAROTID shows no plaque. The LEFT CAROTID shows noplaque  There is noplaque formation noted on the LEFT and on the RIGHT  side. Consider a repeat Carotid doppler if clinical situation and symptoms warrant in 36 months. Patient should be encouraged to change lifestyles , regular exercise and dietary modification.    Corky Downs, MD [facility] [address] [place], [place] [address] Dept: [phone] Dept Fax: [phone]    By signing my name below, I, Mal Misty, attest that this documentation has been prepared under the direction and in the presence of Corky Downs, MD. Electronically Signed: Doneta Public 10/26/19, 10:33 AM   I personally performed the services described in this documentation, which was SCRIBED in my presence. The recorded information has been reviewed and considered accurate. It has been edited as necessary during review. Corky Downs, MD

## 2019-11-20 IMAGING — MG STEREOTACTIC VACUUM ASSIST RIGHT
7 series · 7 of 7 positions shown · non-contrast
Comparison: Previous exams.

CLINICAL DATA: 31-year-old female with indeterminate right breast
calcifications.

EXAM:
LEFT BREAST STEREOTACTIC CORE NEEDLE BIOPSY

[R (1 of 7)]
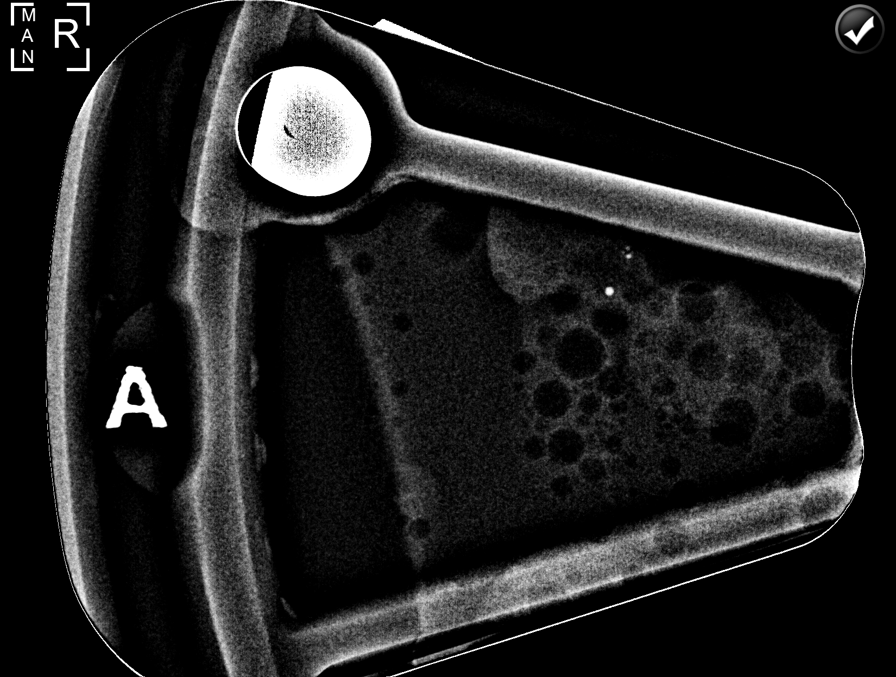

[R (2 of 7)]
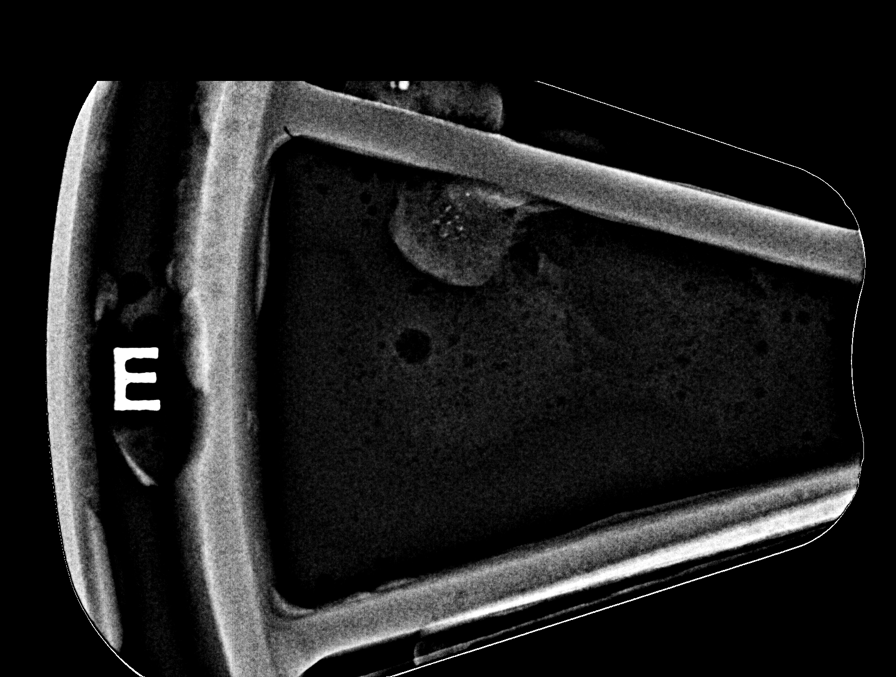

[R (3 of 7)]
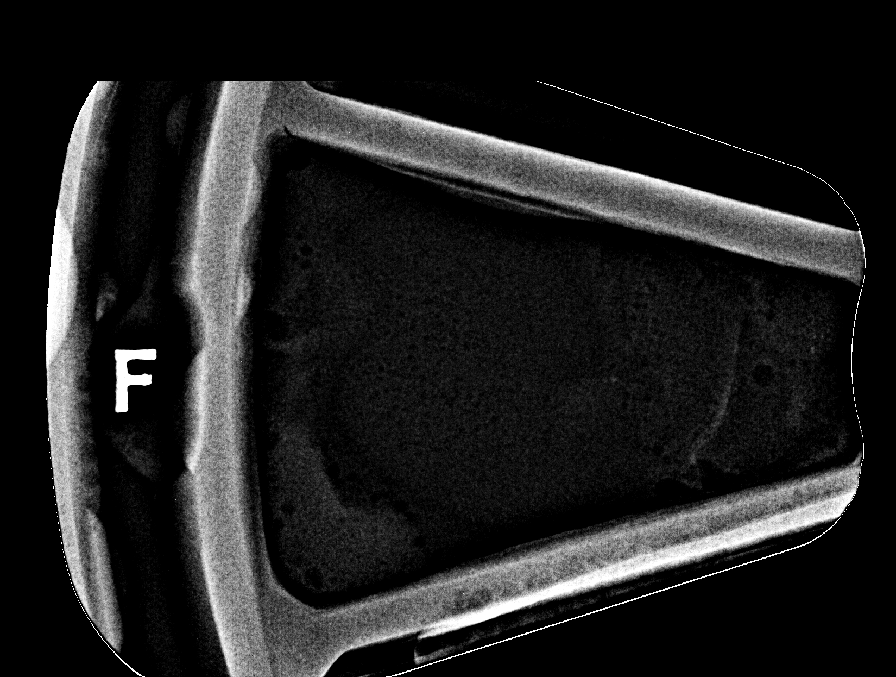

[R (4 of 7)]
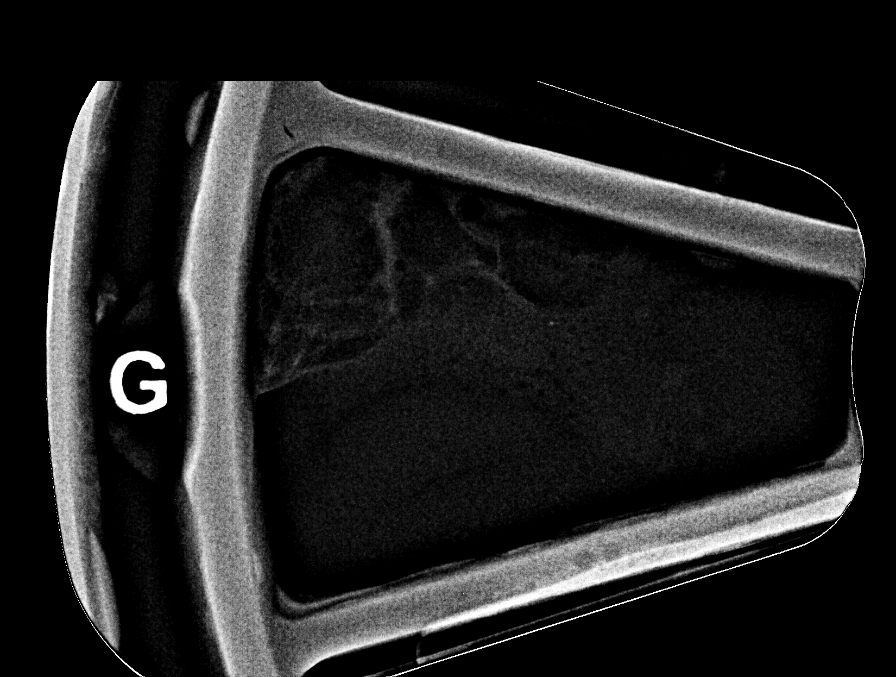

[R (5 of 7)]
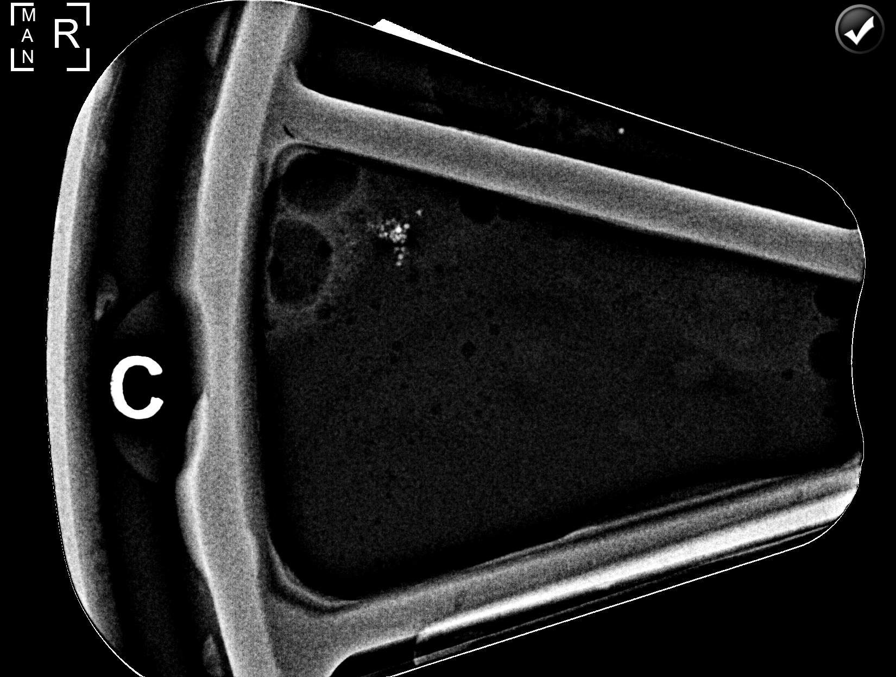

[R (6 of 7)]
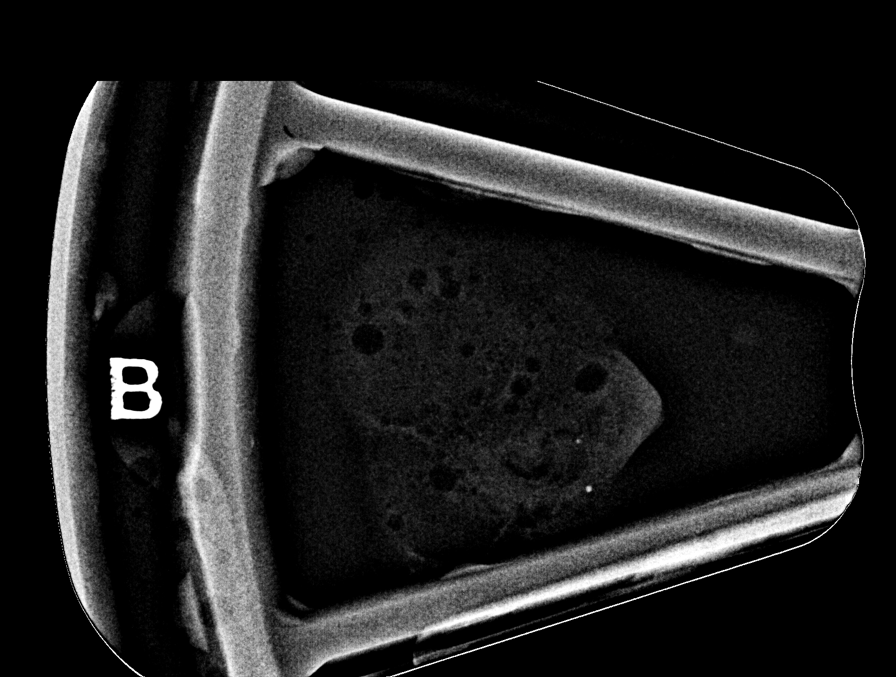

[R (7 of 7)]
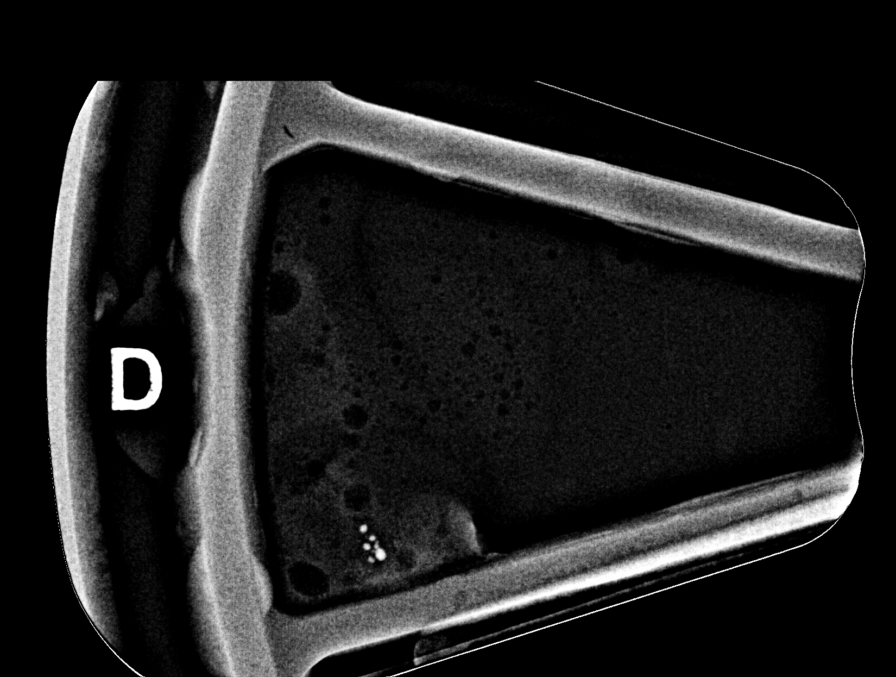

[7 of 7 positions shown; findings below may reference images not displayed]



Using sterile technique and 1% Lidocaine as local anesthetic, under
stereotactic guidance, a 9 gauge vacuum assisted device was used to
perform core needle biopsy of calcifications in knee lateral aspect
of the right breast using a lateral approach. Specimen radiograph
was performed showing calcifications in multiple specimens.
Specimens with calcifications are identified for pathology.

Lesion quadrant: Lower outer quadrant.

At the conclusion of the procedure, a coil shaped tissue marker clip
was deployed into the biopsy cavity. Follow-up 2-view mammogram was
performed and dictated separately.
IMPRESSION: Stereotactic-guided biopsy of right breast calcifications. No
apparent complications.

## 2020-02-10 ENCOUNTER — Telehealth: Payer: Self-pay

## 2020-02-10 NOTE — Telephone Encounter (Signed)
Pt left msg on triage about a "bulge" she noticed recently on her abdomen right side under navel, but not pelvic area. Denies any pain. Advised to see PCP, maybe even gastro? She will reach out to [place] if she needs help with referral.

## 2020-02-16 ENCOUNTER — Ambulatory Visit: Payer: BC Managed Care – PPO | Admitting: Internal Medicine

## 2020-03-18 DIAGNOSIS — B9689 Other specified bacterial agents as the cause of diseases classified elsewhere: Secondary | ICD-10-CM | POA: Diagnosis not present

## 2020-03-18 DIAGNOSIS — Z03818 Encounter for observation for suspected exposure to other biological agents ruled out: Secondary | ICD-10-CM | POA: Diagnosis not present

## 2020-03-18 DIAGNOSIS — J019 Acute sinusitis, unspecified: Secondary | ICD-10-CM | POA: Diagnosis not present

## 2020-03-18 DIAGNOSIS — J209 Acute bronchitis, unspecified: Secondary | ICD-10-CM | POA: Diagnosis not present

## 2020-05-07 DIAGNOSIS — J069 Acute upper respiratory infection, unspecified: Secondary | ICD-10-CM | POA: Diagnosis not present

## 2020-05-07 DIAGNOSIS — Z20828 Contact with and (suspected) exposure to other viral communicable diseases: Secondary | ICD-10-CM | POA: Diagnosis not present

## 2020-06-16 DIAGNOSIS — R5383 Other fatigue: Secondary | ICD-10-CM | POA: Diagnosis not present

## 2020-06-16 DIAGNOSIS — U071 COVID-19: Secondary | ICD-10-CM | POA: Diagnosis not present

## 2020-06-23 DIAGNOSIS — U071 COVID-19: Secondary | ICD-10-CM | POA: Diagnosis not present

## 2020-06-23 DIAGNOSIS — R7989 Other specified abnormal findings of blood chemistry: Secondary | ICD-10-CM | POA: Diagnosis not present

## 2020-06-23 DIAGNOSIS — R5383 Other fatigue: Secondary | ICD-10-CM | POA: Diagnosis not present

## 2020-06-23 DIAGNOSIS — R42 Dizziness and giddiness: Secondary | ICD-10-CM | POA: Diagnosis not present

## 2020-06-23 DIAGNOSIS — R0781 Pleurodynia: Secondary | ICD-10-CM | POA: Diagnosis not present

## 2020-07-27 DIAGNOSIS — H43813 Vitreous degeneration, bilateral: Secondary | ICD-10-CM | POA: Diagnosis not present

## 2020-09-07 DIAGNOSIS — D224 Melanocytic nevi of scalp and neck: Secondary | ICD-10-CM | POA: Diagnosis not present

## 2020-09-07 DIAGNOSIS — L570 Actinic keratosis: Secondary | ICD-10-CM | POA: Diagnosis not present

## 2020-09-07 DIAGNOSIS — L720 Epidermal cyst: Secondary | ICD-10-CM | POA: Diagnosis not present

## 2020-09-07 DIAGNOSIS — L815 Leukoderma, not elsewhere classified: Secondary | ICD-10-CM | POA: Diagnosis not present

## 2020-09-07 DIAGNOSIS — Z86018 Personal history of other benign neoplasm: Secondary | ICD-10-CM | POA: Diagnosis not present

## 2020-09-08 ENCOUNTER — Other Ambulatory Visit: Payer: Self-pay

## 2020-09-08 ENCOUNTER — Encounter: Payer: Self-pay | Admitting: Obstetrics and Gynecology

## 2020-09-08 ENCOUNTER — Ambulatory Visit (INDEPENDENT_AMBULATORY_CARE_PROVIDER_SITE_OTHER): Payer: BC Managed Care – PPO | Admitting: Obstetrics and Gynecology

## 2020-09-08 VITALS — BP 122/74 | Ht 66.0 in | Wt 168.0 lb

## 2020-09-08 DIAGNOSIS — Z1339 Encounter for screening examination for other mental health and behavioral disorders: Secondary | ICD-10-CM

## 2020-09-08 DIAGNOSIS — Z01419 Encounter for gynecological examination (general) (routine) without abnormal findings: Secondary | ICD-10-CM | POA: Diagnosis not present

## 2020-09-08 DIAGNOSIS — Z1331 Encounter for screening for depression: Secondary | ICD-10-CM | POA: Diagnosis not present

## 2020-09-08 NOTE — Progress Notes (Signed)
Gynecology Annual Exam  PCP: Corky Downs, MD  Chief Complaint  Patient presents with   Annual Exam    History of Present Illness:  Gabrielle Mcdowell is a 35 y.o. [phone] who LMP was Patient's last menstrual period was 08/24/2020., presents today for her annual examination.  Her menses are regular every 28-30 days, lasting 6 day(s).  Dysmenorrhea mild, occurring first 1-2 days of flow. She does not have intermenstrual bleeding.  She is sexually active.  .  Last Pap: 06/2018  Results were: no abnormalities /neg HPV DNA negative Hx of STDs: none  There is no FH of breast cancer. There is no FH of ovarian cancer. The patient does do self-breast exams.  Tobacco use: The patient denies current or previous tobacco use. Alcohol use: none Exercise: yes.   The patient wears seatbelts: yes.   The patient reports that domestic violence in her life is absent.   Past Medical History:  Diagnosis Date   Alpha-1-antitrypsin deficiency (HCC)    Anxiety    GERD (gastroesophageal reflux disease)    Hemorrhoids    Mitral valve prolapse     Past Surgical History:  Procedure Laterality Date   ESOPHAGOGASTRODUODENOSCOPY ENDOSCOPY      Prior to Admission medications   Medication Sig Start Date End Date Taking? Authorizing Provider  B Complex Vitamins (VITAMIN B COMPLEX PO) Take by mouth.    [provider]  Cholecalciferol (VITAMIN D3) 1000 units CAPS Take 1 capsule by mouth daily.    [provider]  lidocaine (XYLOCAINE) 5 % ointment Apply 1 application topically 3 (three) times daily as needed. 07/09/18   Natale Milch, MD  Magnesium 100 MG CAPS Take 100 mg by mouth.    [provider]  Prenatal Ca Carb-B6-B12-FA (BP FOLINATAL PLUS B) 1 MG TABS Take by mouth.    [provider]    Allergies  Allergen Reactions   Brompheniramine-Pseudoeph Hives   Clarithromycin Hives   Robitussin (Alcohol Free) [Guaifenesin] Hives   Sulfa Antibiotics Hives     Obstetric History: [id number]  Social History   Socioeconomic History   Marital status: Married    Spouse name: Not on file   Number of children: Not on file   Years of education: Not on file   Highest education level: Not on file  Occupational History   Not on file  Tobacco Use   Smoking status: Never   Smokeless tobacco: Never  Vaping Use   Vaping Use: Never used  Substance and Sexual Activity   Alcohol use: No   Drug use: No   Sexual activity: Yes    Birth control/protection: None, Surgical    Comment: vasectomy  Other Topics Concern   Not on file  Social History Narrative   Not on file   Social Determinants of Health   Financial Resource Strain: Not on file  Food Insecurity: Not on file  Transportation Needs: Not on file  Physical Activity: Not on file  Stress: Not on file  Social Connections: Not on file  Intimate Partner Violence: Not on file    Family History  Problem Relation Age of Onset   Hypertension Father    Breast cancer Other    Breast cancer Maternal Aunt 52       stage 0   Prostate cancer Maternal Uncle     Review of Systems  Constitutional: Negative.   HENT: Negative.    Eyes: Negative.   Respiratory: Negative.    Cardiovascular:  Negative.   Gastrointestinal: Negative.   Genitourinary: Negative.   Musculoskeletal: Negative.   Skin: Negative.   Neurological: Negative.   Psychiatric/Behavioral: Negative.      Physical Exam BP 122/74   Ht 5\' 6"  (1.676 m)   Wt 168 lb (76.2 kg)   LMP 08/24/2020   BMI 27.12 kg/m    Physical Exam Constitutional:      General: She is not in acute distress.    Appearance: Normal appearance. She is well-developed.  Genitourinary:     Vulva and bladder normal.     Right Labia: No rash, tenderness, lesions, skin changes or Bartholin's cyst.    Left Labia: No tenderness, lesions, skin changes, Bartholin's cyst or rash.    No inguinal adenopathy present in the right or left side.    Pelvic Tanner  Score: 5/5.    No vaginal discharge, erythema, tenderness or bleeding.      Right Adnexa: not tender, not full and no mass present.    Left Adnexa: not tender, not full and no mass present.    No cervical motion tenderness, discharge, lesion or polyp.     Uterus is not enlarged or tender.     No uterine mass detected.    Pelvic exam was performed with patient in the lithotomy position.  Breasts:    Right: No inverted nipple, mass, nipple discharge, skin change or tenderness.     Left: No inverted nipple, mass, nipple discharge, skin change or tenderness.  HENT:     Head: Normocephalic and atraumatic.  Eyes:     General: No scleral icterus.    Conjunctiva/sclera: Conjunctivae normal.  Neck:     Thyroid: No thyromegaly.  Cardiovascular:     Rate and Rhythm: Normal rate and regular rhythm.     Heart sounds: No murmur heard.   No friction rub. No gallop.  Pulmonary:     Effort: Pulmonary effort is normal. No respiratory distress.     Breath sounds: Normal breath sounds. No wheezing or rales.  Abdominal:     General: Bowel sounds are normal. There is no distension.     Palpations: Abdomen is soft. There is no mass.     Tenderness: There is no abdominal tenderness. There is no guarding or rebound.     Hernia: There is no hernia in the left inguinal area or right inguinal area.  Musculoskeletal:        General: No swelling or tenderness. Normal range of motion.     Cervical back: Normal range of motion and neck supple.  Lymphadenopathy:     Cervical: No cervical adenopathy.     Lower Body: No right inguinal adenopathy. No left inguinal adenopathy.  Neurological:     General: No focal deficit present.     Mental Status: She is alert and oriented to person, place, and time.     Cranial Nerves: No cranial nerve deficit.  Skin:    General: Skin is warm and dry.     Findings: No erythema or rash.  Psychiatric:        Mood and Affect: Mood normal.        Behavior: Behavior normal.         Judgment: Judgment normal.    Female chaperone present for pelvic and breast  portions of the physical exam  Results: AUDIT Questionnaire (screen for alcoholism): 0 PHQ-9: 0   Assessment: 35 y.o. G7P2002 female here for routine annual gynecologic examination  Plan: Problem List Items Addressed  This Visit   None Visit Diagnoses     Women's annual routine gynecological examination    -  Primary   Screening for depression       Screening for alcoholism           Screening: -- Blood pressure screen normal -- Weight screening: normal -- Depression screening negative (PHQ-9) -- Nutrition: normal -- cholesterol screening: not due for screening -- osteoporosis screening: not due -- tobacco screening: not using -- alcohol screening: AUDIT questionnaire indicates low-risk usage. -- family history of breast cancer screening: done. not at high risk. -- no evidence of domestic violence or intimate partner violence. -- STD screening: gonorrhea/chlamydia NAAT not collected per patient request. -- pap smear not collected per ASCCP guidelines -- HPV vaccination series: has not received - pt refuses  Thomasene Mohair, MD 09/08/2020 11:15 AM

## 2020-09-21 DIAGNOSIS — R946 Abnormal results of thyroid function studies: Secondary | ICD-10-CM | POA: Diagnosis not present

## 2020-10-23 DIAGNOSIS — J40 Bronchitis, not specified as acute or chronic: Secondary | ICD-10-CM | POA: Diagnosis not present

## 2020-10-23 DIAGNOSIS — J019 Acute sinusitis, unspecified: Secondary | ICD-10-CM | POA: Diagnosis not present

## 2020-10-23 DIAGNOSIS — B9689 Other specified bacterial agents as the cause of diseases classified elsewhere: Secondary | ICD-10-CM | POA: Diagnosis not present

## 2021-01-04 DIAGNOSIS — R7989 Other specified abnormal findings of blood chemistry: Secondary | ICD-10-CM | POA: Diagnosis not present

## 2021-02-06 ENCOUNTER — Telehealth: Payer: Self-pay

## 2021-02-06 NOTE — Telephone Encounter (Signed)
Pt calling; in the last several weeks has noticed very light splotches on her right breast; at first she thought it was an impression like a bruise from her todler running into her; now it's fading and diffusing; should she have Derm to look at it or come see [place].  [phone]  Pt states there is no texture, no lump underneath; it's like someone spilt sweet tea on her.  Adv to start c Derm and if they think we should see her then call and schedule appt.  Pt is paranoid b/c she has a friend that has just been dx'd c breast cancer at age 77; also concerned it could be inflammatory breast cancer; adv that is rare.

## 2021-04-24 ENCOUNTER — Other Ambulatory Visit: Payer: Self-pay | Admitting: Internal Medicine

## 2021-04-24 DIAGNOSIS — E0590 Thyrotoxicosis, unspecified without thyrotoxic crisis or storm: Secondary | ICD-10-CM

## 2021-05-15 ENCOUNTER — Other Ambulatory Visit: Payer: Self-pay

## 2021-05-16 ENCOUNTER — Other Ambulatory Visit: Payer: Self-pay

## 2021-09-06 IMAGING — US US BREAST*R* LIMITED INC AXILLA
1 series · 6 of 6 positions shown · non-contrast
Comparison: Previous exam(s).

CLINICAL DATA: 33-year-old female presents with
asymmetry/indentation in the slightly upper/periareolar right breast
as well as tenderness/palpable abnormality in the right axillary
tail. Given she is currently breast feeding, the patient declines
mammography today.

EXAM:
ULTRASOUND OF THE RIGHT BREAST

[Series 1: us breast*right* limited inc axilla · 0.08mm/px · 6 of 6 slices shown]
[im 1/6]
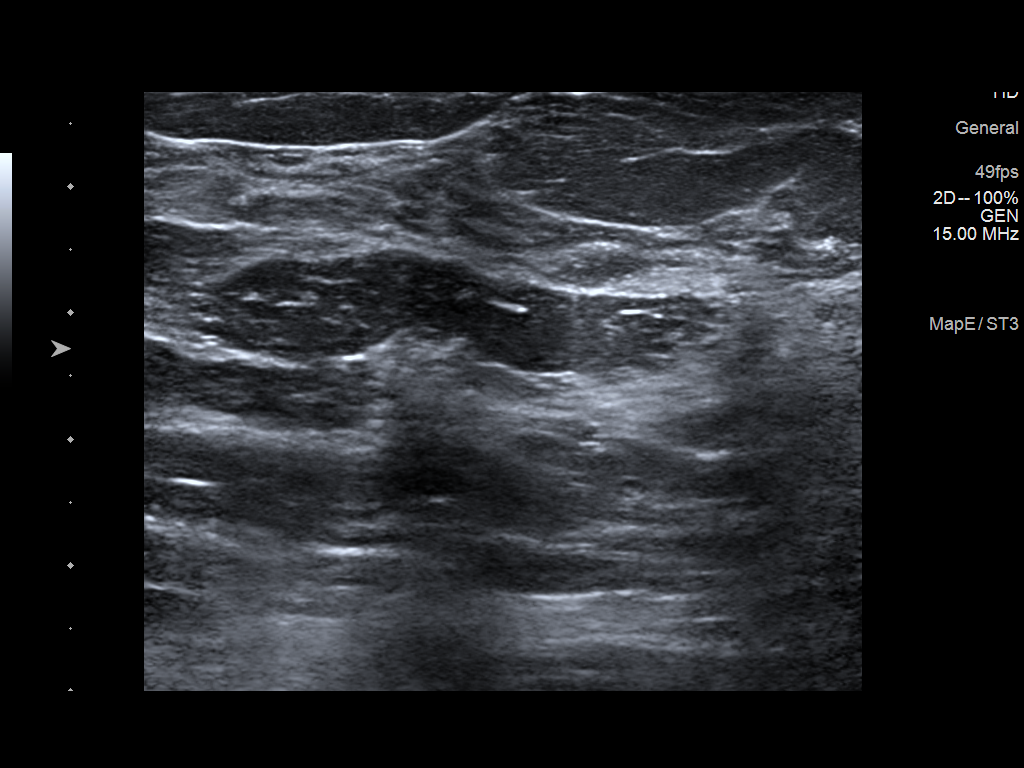
[im 2/6]
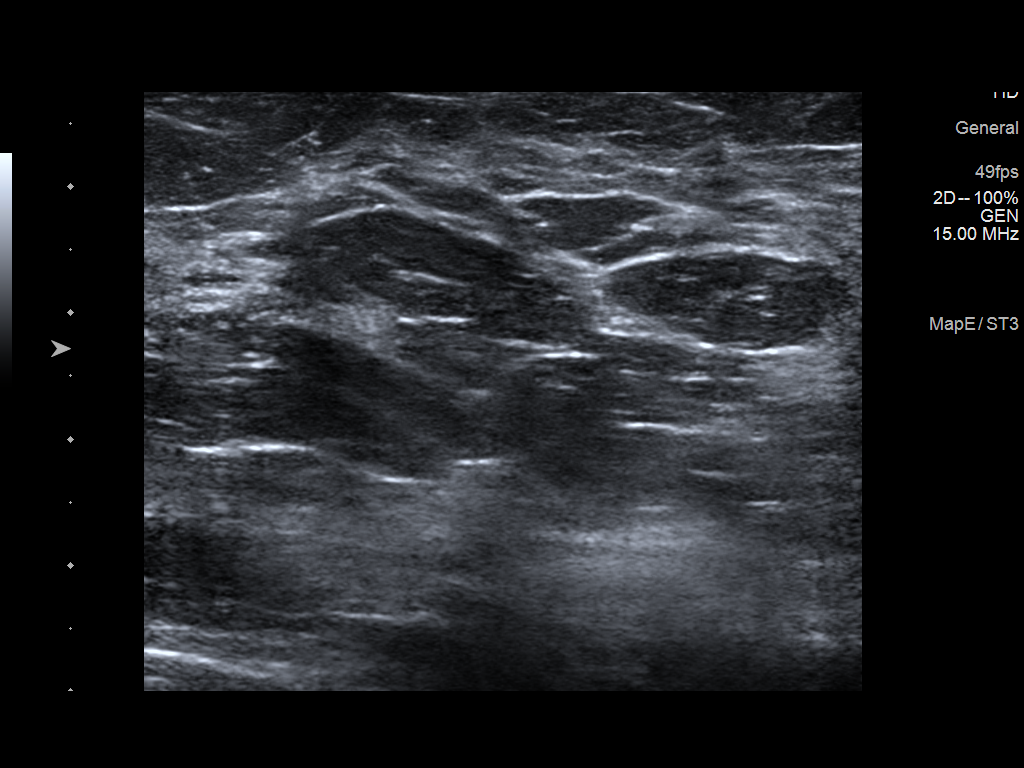
[im 3/6]
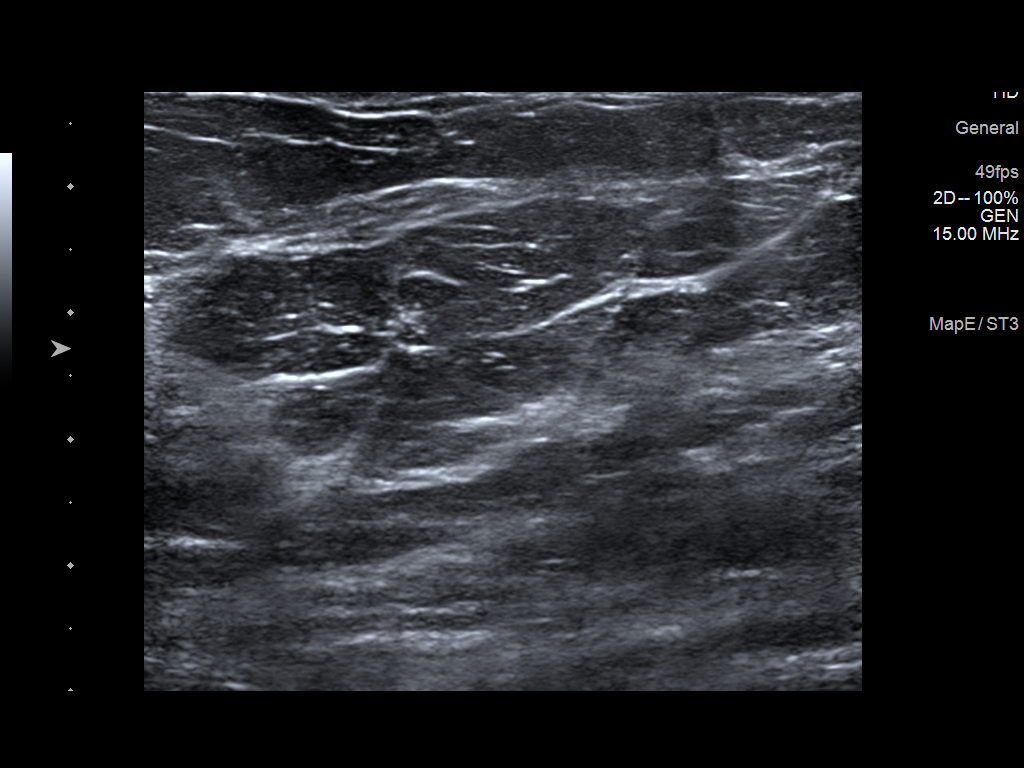
[im 4/6]
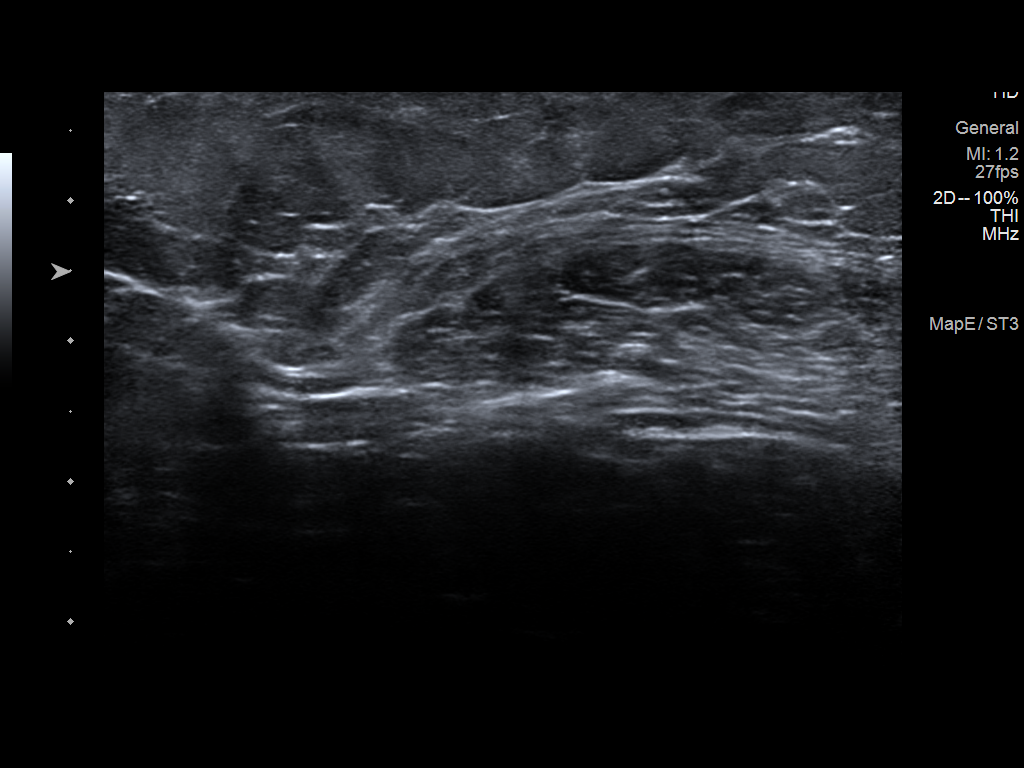
[im 5/6]
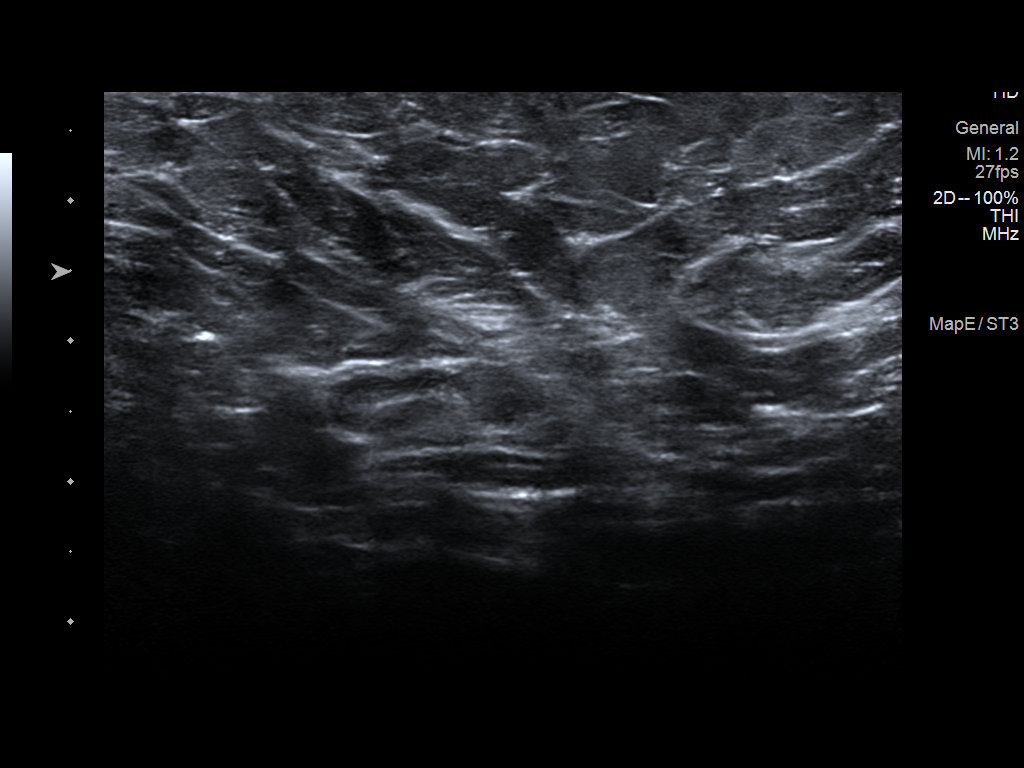
[im 6/6]
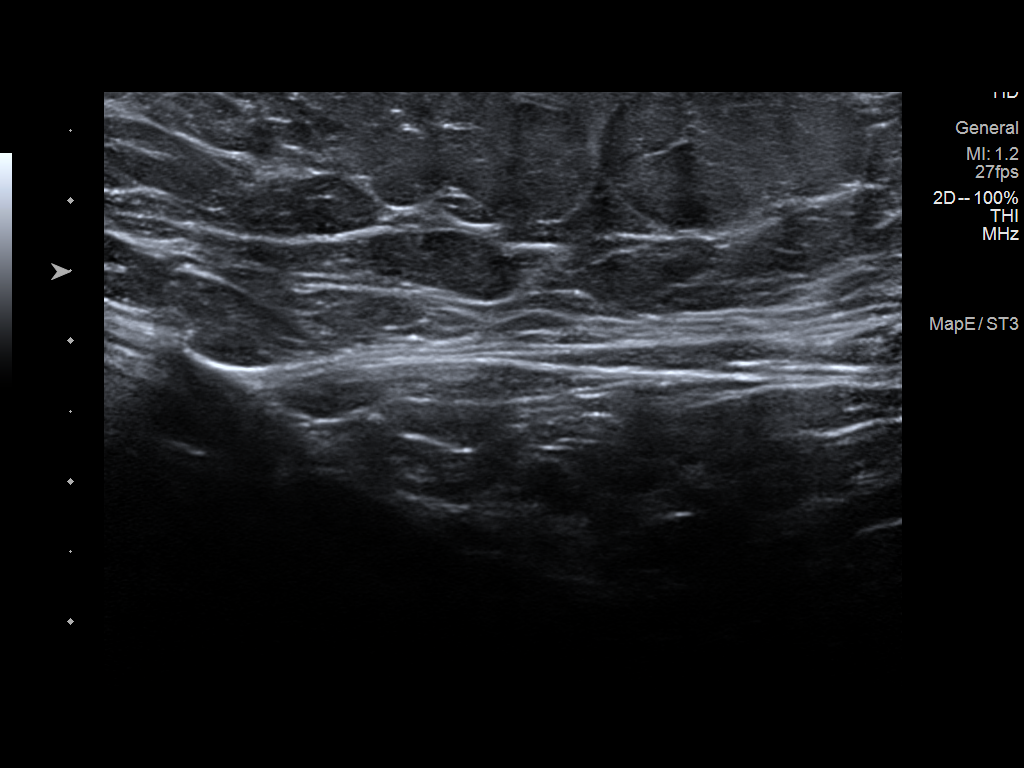

[6 of 6 positions shown; findings below may reference images not displayed]

FINDINGS: Physical examination of the slightly upper/periareolar right breast
does not reveal any palpable abnormalities. The right breast is
normal in appearance. Physical examination of the right axillary
tail does not reveal any palpable masses.

Targeted ultrasound of the right upper/periareolar right breast was
performed as well as ultrasound of the right axillary tail. No
suspicious masses or abnormalities identified, only heterogeneous
fibroglandular tissue identified in these locations. Few scattered
lymph nodes of normal morphology are identified in the right
axillary tail.
IMPRESSION: No sonographic abnormalities identified at the sites of concern in
the right breast.

RECOMMENDATION:
1. Recommend further management of the right breast areas of concern
be based on clinical assessment.

2. Screening mammogram at age 40 unless there are persistent or
intervening clinical concerns. (Code:2Q-G-EZX)

I have discussed the findings and recommendations with the patient.
If applicable, a reminder letter will be sent to the patient
regarding the next appointment.

BI-RADS CATEGORY  1: Negative.

## 2021-10-07 NOTE — Progress Notes (Deleted)
PCP:  Cletis Athens, MD   No chief complaint on file.    HPI:      [name] is a 36 y.o. [phone] whose LMP was No LMP recorded., presents today for her annual examination.  Her menses are regular every 28-30 days, lasting 6 days.  Dysmenorrhea {dysmen:716}. She {does:18564} have intermenstrual bleeding.  Sex activity: {sex active: 315163}.  Last Pap: 06/29/18  Results were: no abnormalities /neg HPV DNA  Hx of STDs: {STD hx:14358}  Last mammogram: 01/12/19 for possible RT breast mass; Results were: normal--routine follow-up age 44 There is no FH of breast cancer. There is no FH of ovarian cancer. The patient {does:18564} do self-breast exams.  Tobacco use: {tob:20664} Alcohol use: {Alcohol:11675} No drug use.  Exercise: {exercise:31265}  She {does:18564} get adequate calcium and Vitamin D in her diet.  Patient Active Problem List   Diagnosis Date Noted   Benign paroxysmal positional vertigo of left ear 09/16/2019   Acute allergic mucoid otitis media of both ears 09/16/2019   Tinnitus of both ears 09/16/2019   Postpartum care following vaginal delivery 10/21/2017   Labor and delivery, indication for care 10/19/2017   Post-dates pregnancy 10/19/2017   High risk pregnancy with low PAPPA 09/17/2017   Pregnancy 09/01/2017   Supervision of other normal pregnancy, antepartum 02/24/2017   Galactorrhea 12/30/2016   Cyst of right ovary 12/30/2016   Mass of upper inner quadrant of left breast 12/30/2016   RLQ abdominal pain 09/19/2016   Suprapubic pain 09/19/2016   Mitral valve prolapse     Past Surgical History:  Procedure Laterality Date   ESOPHAGOGASTRODUODENOSCOPY ENDOSCOPY      Family History  Problem Relation Age of Onset   Hypertension Father    Breast cancer Other    Breast cancer Maternal Aunt 52       stage 0   Prostate cancer Maternal Uncle     Social History   Socioeconomic History   Marital status: Married    Spouse name: Not on file    Number of children: Not on file   Years of education: Not on file   Highest education level: Not on file  Occupational History   Not on file  Tobacco Use   Smoking status: Never   Smokeless tobacco: Never  Vaping Use   Vaping Use: Never used  Substance and Sexual Activity   Alcohol use: No   Drug use: No   Sexual activity: Yes    Birth control/protection: None, Surgical    Comment: vasectomy  Other Topics Concern   Not on file  Social History Narrative   Not on file   Social Determinants of Health   Financial Resource Strain: Low Risk  (09/01/2017)   Overall Financial Resource Strain (CARDIA)    Difficulty of Paying Living Expenses: Not hard at all  Food Insecurity: No Food Insecurity (09/01/2017)   Hunger Vital Sign    Worried About Estate manager/land agent of Food in the Last Year: Never true    Ran Out of Food in the Last Year: Never true  Transportation Needs: No Transportation Needs (09/01/2017)   PRAPARE - Hydrologist (Medical): No    Lack of Transportation (Non-Medical): No  Physical Activity: Insufficiently Active (09/01/2017)   Exercise Vital Sign    Days of Exercise per Week: 3 days    Minutes of Exercise per Session: 30 min  Stress: Stress Concern Present (09/01/2017)   [place] -  Occupational Stress Questionnaire    Feeling of Stress : To some extent  Social Connections: Moderately Integrated (09/01/2017)   Social Connection and Isolation Panel [NHANES]    Frequency of Communication with Friends and Family: Twice a week    Frequency of Social Gatherings with Friends and Family: Once a week    Attends Religious Services: 1 to 4 times per year    Active Member of [employer] or Organizations: No    Attends Banker Meetings: Never    Marital Status: Married  Catering manager Violence: Not At Risk (09/01/2017)   Humiliation, Afraid, Rape, and Kick questionnaire    Fear of Current or Ex-Partner: No     Emotionally Abused: No    Physically Abused: No    Sexually Abused: No     Current Outpatient Medications:    B Complex Vitamins (VITAMIN B COMPLEX PO), Take by mouth., Disp: , Rfl:    Cholecalciferol (VITAMIN D3) 1000 units CAPS, Take 1 capsule by mouth daily., Disp: , Rfl:    lidocaine (XYLOCAINE) 5 % ointment, Apply 1 application topically 3 (three) times daily as needed., Disp: 35.44 g, Rfl: 6   Magnesium 100 MG CAPS, Take 100 mg by mouth., Disp: , Rfl:    Prenatal Ca Carb-B6-B12-FA (BP FOLINATAL PLUS B) 1 MG TABS, Take by mouth., Disp: , Rfl:      ROS:  Review of Systems BREAST: No symptoms   Objective: There were no vitals taken for this visit.   OBGyn Exam  Results: No results found for this or any previous visit (from the past 24 hour(s)).  Assessment/Plan: No diagnosis found.  No orders of the defined types were placed in this encounter.            GYN counsel {counseling: 16159}     F/U  No follow-ups on file.  Kreg Earhart B. Sheyla Zaffino, PA-C 10/07/2021 4:04 PM

## 2021-10-08 ENCOUNTER — Ambulatory Visit: Payer: Self-pay | Admitting: Obstetrics and Gynecology

## 2021-11-12 NOTE — Progress Notes (Incomplete)
PCP:  Cletis Athens, MD   No chief complaint on file.    HPI:      Gabrielle Mcdowell is a 36 y.o. [phone] whose LMP was No LMP recorded., presents today for her annual examination.  Her menses are regular every 28-30 days, lasting 6 days.  Dysmenorrhea {dysmen:716}. She {does:18564} have intermenstrual bleeding.  Sex activity: {sex active: 315163}.  Last Pap: 06/29/18  Results were: no abnormalities /neg HPV DNA  Hx of STDs: {STD hx:14358}  Last mammogram: 12/20 for possible RT breast mass;  Results were: normal, repeat mammos starting age 56 There is no FH of breast cancer. There is no FH of ovarian cancer. The patient {does:18564} do self-breast exams.  Tobacco use: {tob:20664} Alcohol use: {Alcohol:11675} No drug use.  Exercise: {exercise:31265}  She {does:18564} get adequate calcium and Vitamin D in her diet.  Patient Active Problem List   Diagnosis Date Noted  . Benign paroxysmal positional vertigo of left ear 09/16/2019  . Acute allergic mucoid otitis media of both ears 09/16/2019  . Tinnitus of both ears 09/16/2019  . Postpartum care following vaginal delivery 10/21/2017  . Labor and delivery, indication for care 10/19/2017  . Post-dates pregnancy 10/19/2017  . High risk pregnancy with low PAPPA 09/17/2017  . Pregnancy 09/01/2017  . Supervision of other normal pregnancy, antepartum 02/24/2017  . Galactorrhea 12/30/2016  . Cyst of right ovary 12/30/2016  . Mass of upper inner quadrant of left breast 12/30/2016  . RLQ abdominal pain 09/19/2016  . Suprapubic pain 09/19/2016  . Mitral valve prolapse     Past Surgical History:  Procedure Laterality Date  . ESOPHAGOGASTRODUODENOSCOPY ENDOSCOPY      Family History  Problem Relation Age of Onset  . Hypertension Father   . Breast cancer Other   . Breast cancer Maternal Aunt 52       stage 0  . Prostate cancer Maternal Uncle     Social History   Socioeconomic History  . Marital status: Married     Spouse name: Not on file  . Number of children: Not on file  . Years of education: Not on file  . Highest education level: Not on file  Occupational History  . Not on file  Tobacco Use  . Smoking status: Never  . Smokeless tobacco: Never  Vaping Use  . Vaping Use: Never used  Substance and Sexual Activity  . Alcohol use: No  . Drug use: No  . Sexual activity: Yes    Birth control/protection: None, Surgical    Comment: vasectomy  Other Topics Concern  . Not on file  Social History Narrative  . Not on file   Social Determinants of Health   Financial Resource Strain: Low Risk  (09/01/2017)   Overall Financial Resource Strain (CARDIA)   . Difficulty of Paying Living Expenses: Not hard at all  Food Insecurity: No Food Insecurity (09/01/2017)   Hunger Vital Sign   . Worried About Charity fundraiser in the Last Year: Never true   . Ran Out of Food in the Last Year: Never true  Transportation Needs: No Transportation Needs (09/01/2017)   PRAPARE - Transportation   . Lack of Transportation (Medical): No   . Lack of Transportation (Non-Medical): No  Physical Activity: Insufficiently Active (09/01/2017)   Exercise Vital Sign   . Days of Exercise per Week: 3 days   . Minutes of Exercise per Session: 30 min  Stress: Stress Concern Present (09/01/2017)   [employer]  of Occupational Health - Occupational Stress Questionnaire   . Feeling of Stress : To some extent  Social Connections: Moderately Integrated (09/01/2017)   Social Connection and Isolation Panel [NHANES]   . Frequency of Communication with Friends and Family: Twice a week   . Frequency of Social Gatherings with Friends and Family: Once a week   . Attends Religious Services: 1 to 4 times per year   . Active Member of Clubs or Organizations: No   . Attends Archivist Meetings: Never   . Marital Status: Married  Human resources officer Violence: Not At Risk (09/01/2017)   Humiliation, Afraid, Rape, and Kick  questionnaire   . Fear of Current or Ex-Partner: No   . Emotionally Abused: No   . Physically Abused: No   . Sexually Abused: No     Current Outpatient Medications:  .  B Complex Vitamins (VITAMIN B COMPLEX PO), Take by mouth., Disp: , Rfl:  .  Cholecalciferol (VITAMIN D3) 1000 units CAPS, Take 1 capsule by mouth daily., Disp: , Rfl:  .  lidocaine (XYLOCAINE) 5 % ointment, Apply 1 application topically 3 (three) times daily as needed., Disp: 35.44 g, Rfl: 6 .  Magnesium 100 MG CAPS, Take 100 mg by mouth., Disp: , Rfl:  .  Prenatal Ca Carb-B6-B12-FA (BP FOLINATAL PLUS B) 1 MG TABS, Take by mouth., Disp: , Rfl:      ROS:  Review of Systems BREAST: No symptoms   Objective: There were no vitals taken for this visit.   OBGyn Exam  Results: No results found for this or any previous visit (from the past 24 hour(s)).  Assessment/Plan: No diagnosis found.  No orders of the defined types were placed in this encounter.            GYN counsel {counseling: 16159}     F/U  No follow-ups on file.  Netra Postlethwait B. Mandrell Vangilder, PA-C 11/12/2021 11:16 AM

## 2021-11-13 ENCOUNTER — Ambulatory Visit: Payer: Self-pay | Admitting: Obstetrics and Gynecology

## 2021-11-13 DIAGNOSIS — Z01419 Encounter for gynecological examination (general) (routine) without abnormal findings: Secondary | ICD-10-CM

## 2022-04-17 ENCOUNTER — Telehealth: Payer: Self-pay

## 2022-04-17 NOTE — Telephone Encounter (Signed)
Patient called requesting advise about what she believes to be a boil on her groin. She had some aches and fever that lasted about 48 hours, but has resolved. She was calling just to get some advise because she does not have insurance. I advised her to do warm soaks and warm compresses. I also told her that self pay was around $66 if she needed to come in to be seen. She verbalized understanding.

## 2022-04-18 ENCOUNTER — Ambulatory Visit: Payer: Self-pay | Admitting: Obstetrics and Gynecology

## 2022-07-17 ENCOUNTER — Other Ambulatory Visit: Payer: Self-pay | Admitting: Family Medicine

## 2022-07-17 ENCOUNTER — Ambulatory Visit
Admission: RE | Admit: 2022-07-17 | Discharge: 2022-07-17 | Disposition: A | Discharge status: Home / Self Care | Payer: 59 | Source: Ambulatory Visit | Attending: Family Medicine | Admitting: Family Medicine

## 2022-07-17 ENCOUNTER — Encounter: Payer: Self-pay | Admitting: Family Medicine

## 2022-07-17 DIAGNOSIS — R1031 Right lower quadrant pain: Secondary | ICD-10-CM | POA: Diagnosis present

## 2022-07-17 DIAGNOSIS — R350 Frequency of micturition: Secondary | ICD-10-CM

## 2022-07-17 DIAGNOSIS — R110 Nausea: Secondary | ICD-10-CM | POA: Diagnosis present

## 2022-10-26 ENCOUNTER — Ambulatory Visit (HOSPITAL_COMMUNITY)
Admission: EM | Admit: 2022-10-26 | Discharge: 2022-10-26 | Disposition: A | Discharge status: Home / Self Care | Payer: 59 | Attending: Family Medicine | Admitting: Family Medicine

## 2022-10-26 ENCOUNTER — Encounter (HOSPITAL_COMMUNITY): Payer: Self-pay

## 2022-10-26 DIAGNOSIS — N3090 Cystitis, unspecified without hematuria: Secondary | ICD-10-CM | POA: Insufficient documentation

## 2022-10-26 LAB — POCT URINALYSIS DIP (MANUAL ENTRY)
Bilirubin, UA: NEGATIVE
Glucose, UA: NEGATIVE mg/dL
Ketones, POC UA: NEGATIVE mg/dL
Nitrite, UA: POSITIVE — AB
Protein Ur, POC: 30 mg/dL — AB
Spec Grav, UA: >=1.03 — AB (ref 1.010–1.025)
Urobilinogen, UA: 0.2 U/dL
pH, UA: 5.5 (ref 5.0–8.0)

## 2022-10-26 MED ORDER — CEPHALEXIN 250 MG PO CAPS: 250.0000 mg | ORAL_CAPSULE | Freq: Three times a day (TID) | ORAL | 0 refills | Status: AC

## 2022-10-26 MED ORDER — PHENAZOPYRIDINE HCL 100 MG PO TABS: 100.0000 mg | ORAL_TABLET | Freq: Three times a day (TID) | ORAL | 0 refills | Status: AC | PRN

## 2022-10-26 NOTE — Discharge Instructions (Signed)
The urinalysis did not have any white blood cells here.  Urine culture is sent  Take cephalexin 250 mg--1 capsule 3 times daily for 5 days  Take Pyridium/phenazopyridine 100 mg--1 tablet 3 times daily as needed for urinary pain.  This medication usually makes the urine orange  Drink lots of fluids and especially water

## 2022-10-26 NOTE — ED Provider Notes (Addendum)
[facility]    CSN: [id number] Arrival date & time: 10/26/22  1654      History   Chief Complaint Chief Complaint  Patient presents with   Urinary Frequency    HPI Gabrielle Mcdowell is a 37 y.o. female.    Urinary Frequency  Here for dysuria, urinary frequency, and lower abdominal pressure that began this morning.  No fever or chills or nausea or vomiting Last menstrual cycle was October 3.  She is allergic to Biaxin and sulfa   She did an AZO test strip at home and it was abnormal for leukocytes.  Past Medical History:  Diagnosis Date   Alpha-1-antitrypsin deficiency (HCC)    Anxiety    GERD (gastroesophageal reflux disease)    Hemorrhoids    Mitral valve prolapse     Patient Active Problem List   Diagnosis Date Noted   Benign paroxysmal positional vertigo of left ear 09/16/2019   Acute allergic mucoid otitis media of both ears 09/16/2019   Tinnitus of both ears 09/16/2019   Postpartum care following vaginal delivery 10/21/2017   Labor and delivery, indication for care 10/19/2017   Post-dates pregnancy 10/19/2017   High risk pregnancy with low PAPPA 09/17/2017   Pregnancy 09/01/2017   Supervision of other normal pregnancy, antepartum 02/24/2017   Galactorrhea 12/30/2016   Cyst of right ovary 12/30/2016   Mass of upper inner quadrant of left breast 12/30/2016   RLQ abdominal pain 09/19/2016   Suprapubic pain 09/19/2016   Mitral valve prolapse     Past Surgical History:  Procedure Laterality Date   ESOPHAGOGASTRODUODENOSCOPY ENDOSCOPY      OB History     Gravida  2   Para  2   Term  2   Preterm      AB      Living  2      SAB      IAB      Ectopic      Multiple      Live Births  2            Home Medications    Prior to Admission medications   Medication Sig Start Date End Date Taking? Authorizing Provider  cephALEXin (KEFLEX) 250 MG capsule Take 1 capsule (250 mg total) by mouth 3 (three) times daily  for 5 days. 10/26/22 10/31/22 Yes Anayiah Howden, Janace Aris, MD  phenazopyridine (PYRIDIUM) 100 MG tablet Take 1 tablet (100 mg total) by mouth 3 (three) times daily as needed (urinary pain). 10/26/22  Yes Zenia Resides, MD  B Complex Vitamins (VITAMIN B COMPLEX PO) Take by mouth.    [provider]  Cholecalciferol (VITAMIN D3) 1000 units CAPS Take 1 capsule by mouth daily.    [provider]  Magnesium 100 MG CAPS Take 100 mg by mouth.    [provider]  Prenatal Ca Carb-B6-B12-FA (BP FOLINATAL PLUS B) 1 MG TABS Take by mouth.    [provider]    Family History Family History  Problem Relation Age of Onset   Hypertension Father    Breast cancer Other    Breast cancer Maternal Aunt 47       stage 0   Prostate cancer Maternal Uncle     Social History Social History   Tobacco Use   Smoking status: Never   Smokeless tobacco: Never  Vaping Use   Vaping status: Never Used  Substance Use Topics   Alcohol use: No   Drug use:  No     Allergies   Brompheniramine-pseudoeph, Clarithromycin, Robitussin (alcohol free) [guaifenesin], and Sulfa antibiotics   Review of Systems Review of Systems  Genitourinary:  Positive for frequency.     Physical Exam Triage Vital Signs ED Triage Vitals [10/26/22 1745]  Encounter Vitals Group     BP 128/78     Systolic BP Percentile      Diastolic BP Percentile      Pulse Rate 81     Resp 20     Temp 98.5 F (36.9 C)     Temp Source Oral     SpO2 98 %     Weight 155 lb (70.3 kg)     Height 5\' 6"  (1.676 m)     Head Circumference      Peak Flow      Pain Score 2     Pain Loc      Pain Education      Exclude from Growth Chart    No data found.  Updated Vital Signs BP 128/78 (BP Location: Right Arm)   Pulse 81   Temp 98.5 F (36.9 C) (Oral)   Resp 20   Ht 5\' 6"  (1.676 m)   Wt 70.3 kg   LMP 10/17/2022 (Exact Date)   SpO2 98%   Breastfeeding No   BMI 25.02 kg/m   Visual Acuity Right Eye  Distance:   Left Eye Distance:   Bilateral Distance:    Right Eye Near:   Left Eye Near:    Bilateral Near:     Physical Exam Vitals reviewed.  Constitutional:      General: She is not in acute distress.    Appearance: She is not ill-appearing, toxic-appearing or diaphoretic.  HENT:     Mouth/Throat:     Mouth: Mucous membranes are moist.  Eyes:     Extraocular Movements: Extraocular movements intact.     Conjunctiva/sclera: Conjunctivae normal.     Pupils: Pupils are equal, round, and reactive to light.  Cardiovascular:     Rate and Rhythm: Normal rate and regular rhythm.  Pulmonary:     Effort: Pulmonary effort is normal.     Breath sounds: Normal breath sounds.  Abdominal:     Palpations: Abdomen is soft.     Tenderness: There is abdominal tenderness (suprapubic).  Musculoskeletal:     Cervical back: Neck supple.  Skin:    Coloration: Skin is not jaundiced or pale.  Neurological:     Mental Status: She is alert and oriented to person, place, and time.  Psychiatric:        Behavior: Behavior normal.      UC Treatments / Results  Labs (all labs ordered are listed, but only abnormal results are displayed) Labs Reviewed  POCT URINALYSIS DIP (MANUAL ENTRY) - Abnormal; Notable for the following components:      Result Value   Spec Grav, UA >=1.030 (*)    Blood, UA large (*)    Protein Ur, POC =30 (*)    Nitrite, UA Positive (*)    Leukocytes, UA Trace (*)    All other components within normal limits  URINE CULTURE    EKG   Radiology No results found.  Procedures Procedures (including critical care time)  Medications Ordered in UC Medications - No data to display  Initial Impression / Assessment and Plan / UC Course  I have reviewed the triage vital signs and the nursing notes.  Pertinent labs & imaging results that were  available during my care of the patient were reviewed by me and considered in my medical decision making (see chart for  details).      Urinalysis is clear except the urine is concentrated.  Urine culture is sent.  Keflex is sent and to treat cystitis and Pyridium sent in for the dysuria. Final Clinical Impressions(s) / UC Diagnoses   Final diagnoses:  Cystitis     Discharge Instructions      The urinalysis did not have any white blood cells here.  Urine culture is sent  Take cephalexin 250 mg--1 capsule 3 times daily for 5 days  Take Pyridium/phenazopyridine 100 mg--1 tablet 3 times daily as needed for urinary pain.  This medication usually makes the urine orange  Drink lots of fluids and especially water     ED Prescriptions     Medication Sig Dispense Auth. Provider   cephALEXin (KEFLEX) 250 MG capsule Take 1 capsule (250 mg total) by mouth 3 (three) times daily for 5 days. 15 capsule Zenia Resides, MD   phenazopyridine (PYRIDIUM) 100 MG tablet Take 1 tablet (100 mg total) by mouth 3 (three) times daily as needed (urinary pain). 10 tablet Marlinda Mike Janace Aris, MD      PDMP not reviewed this encounter.   Zenia Resides, MD 10/26/22 Julio Sicks    Zenia Resides, MD 10/26/22 [phone]

## 2022-10-26 NOTE — ED Triage Notes (Signed)
Patient here today with c/o urinary frequency, burning in urination, and lower abd pressure since yesterday.

## 2022-10-28 LAB — URINE CULTURE: Culture: >=100000 — AB

## 2022-10-29 ENCOUNTER — Telehealth (HOSPITAL_COMMUNITY): Payer: Self-pay | Admitting: *Deleted

## 2022-10-29 NOTE — Telephone Encounter (Signed)
Patient was concerned about her medication dose. I have reviewed her prescription which is appropriate.  Patient symptoms are improved, on day 3 of medicine. Advised to continue full course. Can return if needed

## 2022-10-29 NOTE — Telephone Encounter (Signed)
Pt aware and verbalized understanding.  

## 2022-10-29 NOTE — Telephone Encounter (Signed)
She is on day 3 of antibiotics and she is still having some pressure. She states she states she looked on line and the normal treatment with keflex is 1000mg  and she is being treated with keflex 750mg . She wants to know if she needs to just give it more time or if the antibiotic needs changed or increased.

## 2023-06-18 NOTE — Progress Notes (Addendum)
 Medical Nutrition Therapy  Appointment Start time:  1600  Appointment End time:  1704  Primary concerns today: what is a healthy meal pattern  Referral diagnosis: overweight Preferred learning style: no preference indicated Learning readiness:   ready- change in progress   NUTRITION ASSESSMENT    Clinical Medical Hx:  Past Medical History:  Diagnosis Date   Alpha-1-antitrypsin deficiency (HCC)    Anxiety    GERD (gastroesophageal reflux disease)    Hemorrhoids    Mitral valve prolapse     Medications:  Current Outpatient Medications:    B Complex Vitamins (VITAMIN B COMPLEX PO), Take by mouth. (Patient not taking: Reported on 06/23/2023), Disp: , Rfl:    Cholecalciferol (VITAMIN D3) 1000 units CAPS, Take 1 capsule by mouth daily. (Patient not taking: Reported on 06/23/2023), Disp: , Rfl:    Magnesium 100 MG CAPS, Take 100 mg by mouth., Disp: , Rfl:    phenazopyridine  (PYRIDIUM ) 100 MG tablet, Take 1 tablet (100 mg total) by mouth 3 (three) times daily as needed (urinary pain). (Patient not taking: Reported on 06/23/2023), Disp: 10 tablet, Rfl: 0   Prenatal Ca Carb-B6-B12-FA (BP FOLINATAL PLUS B) 1 MG TABS, Take by mouth. (Patient not taking: Reported on 06/23/2023), Disp: , Rfl:   Labs: No results found for: HGBA1C No results found for: CHOL, HDL, LDLCALC, LDLDIRECT, TRIG, CHOLHDL Notable Signs/Symptoms:  BP Readings from Last 3 Encounters:  10/26/22 128/78  09/08/20 122/74  09/16/19 134/86   Lifestyle & Dietary Hx Pt presents today alone. Pt reports she experienced weight gain with her last pregnancy. Pt states she has used fit girl, optavia and whole 30 in the past and states unable to maintain pattern. Pt c/o bloating. Pt c/o dx of mitral valve prolapse. Pt reports she is working multiple part time job. Pt reports she does the sopping and shared cooking with her husband. Pt reports she experiencing acid reflux stating removing the peeling of cucumber and apples is  helpful and decreases her symptoms. Pt would like to know what healthy eating is and how to implement into her lifestyle. All Pt's questions were answered during this encounter.   Estimated daily fluid intake: 64 oz Supplements: none Sleep: good Stress / self-care: 9 out of 10 /self care includes: baths, mediatation, read, exercise, stretch Current average weekly physical activity: dancing 2-3 monthly, zumba twice monthly   24-Hr Dietary Recall First Meal: apple peeled with almond butter, water Snack: lara bar Second Meal: grilled chicken, green bean and bacons cooked in ghee, water or carbonated water  Snack: lara bar  Third Meal: spinach, chicken, water Snack: lara bar with almond butter, unsweetened almond milk  Beverages: unsweetened almond milk, water  NUTRITION DIAGNOSIS  NB-1.1 Food and nutrition-related knowledge deficit As related to no prior nutrition related education .  As evidenced by Pt's reports dietary recall and dietary recall.   NUTRITION INTERVENTION  Nutrition education (E-1) on the following topics:  Fruits & Vegetables: Aim to fill half your plate with a variety of fruits and vegetables. They are rich in vitamins, minerals, and fiber, and can help reduce the risk of chronic diseases. Choose a colorful assortment of fruits and vegetables to ensure you get a wide range of nutrients. Grains and Starches: Make at least half of your grain choices whole grains, such as brown rice, whole wheat bread, and oats. Whole grains provide fiber, which aids in digestion and healthy cholesterol levels. Aim for whole forms of starchy vegetables such as potatoes, sweet potatoes,  beans, peas, and corn, which are fiber rich and provide many vitamins and minerals.  Protein: Incorporate lean sources of protein, such as poultry, fish, beans, nuts, and seeds, into your meals. Protein is essential for building and repairing tissues, staying full, balancing blood sugar, as well as supporting  immune function. Dairy: Include low-fat or fat-free dairy products like milk, yogurt, and cheese in your diet. Dairy foods are excellent sources of calcium and vitamin D, which are crucial for bone health.  Physical Activity: Aim for 60 minutes of moderate intensity physical activity daily and muscle strengthening twice weekly as tolerated. Regular physical activity promotes overall health-including helping to reduce risk for heart disease and diabetes, promoting mental health, and helping us  sleep better.    Handouts Provided Include  Move Your Way-[employer] Plate Planner- [employer]    Learning Style & Readiness for Change Teaching method utilized: Visual & Auditory  Demonstrated degree of understanding via: Teach Back  Barriers to learning/adherence to lifestyle change: unknown  Goals Established by Pt Aim for balanced meals using the plate planner Increase physical activity  MONITORING & EVALUATION Dietary intake, weekly physical activity  Next Steps  Patient is to return.

## 2023-06-23 ENCOUNTER — Encounter: Attending: Physician Assistant | Admitting: Dietician

## 2023-06-23 ENCOUNTER — Other Ambulatory Visit: Payer: Self-pay | Admitting: Obstetrics and Gynecology

## 2023-06-23 DIAGNOSIS — E663 Overweight: Secondary | ICD-10-CM | POA: Insufficient documentation

## 2023-06-23 DIAGNOSIS — Z803 Family history of malignant neoplasm of breast: Secondary | ICD-10-CM

## 2023-06-23 DIAGNOSIS — N644 Mastodynia: Secondary | ICD-10-CM

## 2023-06-23 DIAGNOSIS — Z713 Dietary counseling and surveillance: Secondary | ICD-10-CM | POA: Diagnosis not present

## 2023-06-30 ENCOUNTER — Ambulatory Visit
Admission: RE | Admit: 2023-06-30 | Discharge: 2023-06-30 | Disposition: A | Discharge status: Home / Self Care | Source: Ambulatory Visit | Attending: Obstetrics and Gynecology | Admitting: Obstetrics and Gynecology

## 2023-06-30 DIAGNOSIS — Z803 Family history of malignant neoplasm of breast: Secondary | ICD-10-CM | POA: Diagnosis present

## 2023-06-30 DIAGNOSIS — N644 Mastodynia: Secondary | ICD-10-CM | POA: Diagnosis present

## 2023-07-02 ENCOUNTER — Ambulatory Visit: Payer: Self-pay | Admitting: Obstetrics and Gynecology
